# Patient Record
Sex: Female | Born: 1975 | Race: White | Hispanic: No | Marital: Married | Smoking: Former smoker
Health system: Southern US, Community
[De-identification: ages and names within clinical notes are randomized; demographics above are authoritative.]

## PROBLEM LIST (undated history)

## (undated) ENCOUNTER — Inpatient Hospital Stay (HOSPITAL_COMMUNITY): Payer: Self-pay

## (undated) DIAGNOSIS — A310 Pulmonary mycobacterial infection: Secondary | ICD-10-CM

## (undated) DIAGNOSIS — J45909 Unspecified asthma, uncomplicated: Secondary | ICD-10-CM

## (undated) HISTORY — PX: WISDOM TOOTH EXTRACTION: SHX21

## (undated) HISTORY — PX: BREAST BIOPSY: SHX20

---

## 2001-12-09 HISTORY — PX: SEPTOPLASTY: SUR1290

## 2016-12-09 DIAGNOSIS — A310 Pulmonary mycobacterial infection: Secondary | ICD-10-CM

## 2016-12-09 HISTORY — DX: Pulmonary mycobacterial infection: A31.0

## 2017-12-09 NOTE — L&D Delivery Note (Signed)
Delivery Note At 1:30 PM a viable female was delivered via Vaginal, Spontaneous (Presentation: OA;  ).  APGAR: 7, 8; weight pending.   Placenta status: 3VC, .  Cord: nuchal x1 with the following complications: none.  Cord pH: not sent  Anesthesia:  CLE Episiotomy: None Lacerations: None Suture Repair: N/A Est. Blood Loss (mL):  200cc   It's a girl - "Kelsey Griffith"!!   Mom to postpartum.  Baby to Couplet care / Skin to Skin.  Ranae Pilalise Jennifer Tyrees Chopin 12/08/2018, 1:53 PM

## 2018-05-08 LAB — OB RESULTS CONSOLE RUBELLA ANTIBODY, IGM: RUBELLA: IMMUNE

## 2018-05-08 LAB — OB RESULTS CONSOLE RPR: RPR: NONREACTIVE

## 2018-05-08 LAB — OB RESULTS CONSOLE HEPATITIS B SURFACE ANTIGEN: HEP B S AG: NEGATIVE

## 2018-05-08 LAB — OB RESULTS CONSOLE ANTIBODY SCREEN: Antibody Screen: NEGATIVE

## 2018-05-08 LAB — OB RESULTS CONSOLE ABO/RH: RH Type: POSITIVE

## 2018-05-08 LAB — OB RESULTS CONSOLE GC/CHLAMYDIA
Chlamydia: NEGATIVE
GC PROBE AMP, GENITAL: NEGATIVE

## 2018-05-08 LAB — OB RESULTS CONSOLE HIV ANTIBODY (ROUTINE TESTING): HIV: NONREACTIVE

## 2018-09-24 ENCOUNTER — Encounter (HOSPITAL_COMMUNITY): Payer: Self-pay | Admitting: *Deleted

## 2018-09-24 ENCOUNTER — Inpatient Hospital Stay (HOSPITAL_COMMUNITY)
Admission: AD | Admit: 2018-09-24 | Discharge: 2018-09-24 | Disposition: A | Discharge status: Home / Self Care | Payer: 59 | Source: Ambulatory Visit | Attending: Obstetrics and Gynecology | Admitting: Obstetrics and Gynecology

## 2018-09-24 DIAGNOSIS — Z3A29 29 weeks gestation of pregnancy: Secondary | ICD-10-CM | POA: Insufficient documentation

## 2018-09-24 DIAGNOSIS — R109 Unspecified abdominal pain: Secondary | ICD-10-CM | POA: Diagnosis present

## 2018-09-24 DIAGNOSIS — O4703 False labor before 37 completed weeks of gestation, third trimester: Secondary | ICD-10-CM

## 2018-09-24 HISTORY — DX: Pulmonary mycobacterial infection: A31.0

## 2018-09-24 HISTORY — DX: Unspecified asthma, uncomplicated: J45.909

## 2018-09-24 LAB — URINALYSIS, ROUTINE W REFLEX MICROSCOPIC
BILIRUBIN URINE: NEGATIVE
Glucose, UA: NEGATIVE mg/dL
Hgb urine dipstick: NEGATIVE
KETONES UR: 5 mg/dL — AB
Leukocytes, UA: NEGATIVE
NITRITE: NEGATIVE
Protein, ur: NEGATIVE mg/dL
Specific Gravity, Urine: 1.029 (ref 1.005–1.030)
pH: 5 (ref 5.0–8.0)

## 2018-09-24 NOTE — MAU Provider Note (Signed)
CC:  Chief Complaint  Patient presents with  . Abdominal Pain     First Provider Initiated Contact with Patient 09/24/18 1737      HPI: Kelsey Griffith is a 42 y.o. year old G32P2012 female at [redacted]w[redacted]d weeks gestation who presents to MAU reporting cramping  And occasional pelvic pressure x several days. States she called [facility] and was told to come to MAU for eval.   Associated Sx: Neg for urinary complaints, GI complaints, vaginal discharge Vaginal bleeding: denies Leaking of fluid: Denies Fetal movement: Very active  O:  Patient Vitals for the past 24 hrs:  BP Temp Temp src Pulse Resp SpO2 Height Weight  09/24/18 1651 108/67 98 F (36.7 C) Oral 68 16 99 % 5\' 5"  (1.651 m) 68.5 kg    General: NAD Heart: Regular rate Lungs: Normal rate and effort Abd: Soft, NT, Gravid, S=D Pelvic: NEFG, Neg pooling, neg blood.  Dilation: Closed Effacement (%): Thick Cervical Position: Posterior Station: Ballotable Presentation: Undeterminable Exam by:: Ivonne Andrew, CNM  EFM: 145, Moderate variability, 15 x 15 accelerations, no decelerations Toco: 1 Contraction  Orders Placed This Encounter  Procedures  . Urinalysis, Routine w reflex microscopic  . Encourage fluids  . Discharge patient   No orders of the defined types were placed in this encounter.   A: [redacted]w[redacted]d week IUP 1. Preterm uterine contractions, antepartum, third trimester   FHR reactive  P: Discharge home in stable condition. Preterm labor precautions and fetal kick counts. Push fluids Follow-up as scheduled for prenatal visit or sooner as needed if symptoms worsen. Return to maternity admissions as needed if symptoms worsen.  Katrinka Blazing, [place], CNM 09/24/2018 6:00 PM  3

## 2018-09-24 NOTE — Discharge Instructions (Signed)

## 2018-09-24 NOTE — MAU Note (Signed)
Pt reports she called the office because she was cramping, having some sharp pains off/on. Was told to come for monitoring.

## 2018-11-15 LAB — OB RESULTS CONSOLE GBS: GBS: POSITIVE

## 2018-11-17 ENCOUNTER — Encounter (HOSPITAL_COMMUNITY): Payer: Self-pay | Admitting: *Deleted

## 2018-11-17 ENCOUNTER — Inpatient Hospital Stay (HOSPITAL_COMMUNITY)
Admission: AD | Admit: 2018-11-17 | Discharge: 2018-11-17 | Disposition: A | Discharge status: Home / Self Care | Payer: 59 | Source: Ambulatory Visit | Attending: Obstetrics and Gynecology | Admitting: Obstetrics and Gynecology

## 2018-11-17 DIAGNOSIS — Z87891 Personal history of nicotine dependence: Secondary | ICD-10-CM | POA: Insufficient documentation

## 2018-11-17 DIAGNOSIS — O9989 Other specified diseases and conditions complicating pregnancy, childbirth and the puerperium: Secondary | ICD-10-CM | POA: Diagnosis not present

## 2018-11-17 DIAGNOSIS — O26893 Other specified pregnancy related conditions, third trimester: Secondary | ICD-10-CM

## 2018-11-17 DIAGNOSIS — R197 Diarrhea, unspecified: Secondary | ICD-10-CM | POA: Diagnosis not present

## 2018-11-17 DIAGNOSIS — Z880 Allergy status to penicillin: Secondary | ICD-10-CM | POA: Insufficient documentation

## 2018-11-17 DIAGNOSIS — Z3A37 37 weeks gestation of pregnancy: Secondary | ICD-10-CM | POA: Diagnosis not present

## 2018-11-17 LAB — URINALYSIS, ROUTINE W REFLEX MICROSCOPIC
Bilirubin Urine: NEGATIVE
GLUCOSE, UA: NEGATIVE mg/dL
Hgb urine dipstick: NEGATIVE
KETONES UR: NEGATIVE mg/dL
Leukocytes, UA: NEGATIVE
NITRITE: NEGATIVE
PH: 8 (ref 5.0–8.0)
PROTEIN: NEGATIVE mg/dL
Specific Gravity, Urine: 1.001 — ABNORMAL LOW (ref 1.005–1.030)

## 2018-11-17 MED ORDER — LACTATED RINGERS IV BOLUS
1000.0000 mL | Freq: Once | INTRAVENOUS | Status: AC
  Administered 2018-11-17: 1000 mL via INTRAVENOUS

## 2018-11-17 NOTE — MAU Note (Signed)
Pt C/O diarrhea since around Thanksgiving, feeling heart palpitations at night, unable to sleep.  No vomiting.  Feeling lightheaded & weak this a.m.  One episode of diarrhea this morning, four episodes in all.  "I have contractions all the time," denies bleeding or LOF.  Reports good fetal movement.

## 2018-11-17 NOTE — MAU Provider Note (Signed)
History     CSN: [id number]  Arrival date and time: 11/17/18 1025   First Provider Initiated Contact with Patient 11/17/18 1114      Chief Complaint  Patient presents with  . Diarrhea  . Irregular Heart Beat   HPI Trea Vedia CofferBlack is a 42 y.o. [id number] at 2140w3d who presents with 4 episodes of diarrhea since Thanksgiving Day. She states it comes irregularly and not related to food. She reports no nausea or vomiting. She also reports 2 times in the last 3 weeks where she woke up feeling like she was having palpitations. Denies any symptoms now. She reports difficulty sleeping. She denies any pain, vaginal bleeding or leaking of fluid. Reports normal fetal movement.   OB History    Gravida  4   Para  2   Term  2   Preterm      AB  1   Living  2     SAB      TAB  1   Ectopic      Multiple      Live Births  2           Past Medical History:  Diagnosis Date  . Asthma   . Pulmonary mycobacteria (HCC) 2018    Past Surgical History:  Procedure Laterality Date  . SEPTOPLASTY  2003    Family History  Problem Relation Age of Onset  . Rheum arthritis Mother   . Asthma Mother   . Heart disease Father     Social History   Tobacco Use  . Smoking status: Former Smoker    Types: Cigarettes  . Smokeless tobacco: Never Used  Substance Use Topics  . Alcohol use: Not Currently  . Drug use: Never    Allergies:  Allergies  Allergen Reactions  . Penicillins Anaphylaxis and Swelling    Has patient had a PCN reaction causing immediate rash, facial/tongue/throat swelling, SOB or lightheadedness with hypotension: Yes Has patient had a PCN reaction causing severe rash involving mucus membranes or skin necrosis: No Has patient had a PCN reaction that required hospitalization: No Has patient had a PCN reaction occurring within the last 10 years: No If all of the above answers are "NO", then may proceed with Cephalosporin use.   . Citalopram Other (See Comments) and  Rash    RASH and sedation sedation RASH and sedation     Medications Prior to Admission  Medication Sig Dispense Refill Last Dose  . albuterol (PROVENTIL) (2.5 MG/3ML) 0.083% nebulizer solution Inhale 2.5 mg into the lungs every 6 (six) hours as needed.    emergency    Review of Systems  Constitutional: Negative.  Negative for fatigue and fever.  HENT: Negative.   Respiratory: Negative.  Negative for shortness of breath.   Cardiovascular: Positive for palpitations. Negative for chest pain.  Gastrointestinal: Positive for diarrhea. Negative for abdominal pain, constipation, nausea and vomiting.  Genitourinary: Negative.  Negative for dysuria, vaginal bleeding and vaginal discharge.  Neurological: Negative.  Negative for dizziness and headaches.   Physical Exam   Blood pressure 119/69, pulse 80, temperature 97.9 F (36.6 C), resp. rate 16, weight 72.1 kg, SpO2 100 %.  Physical Exam  Nursing note and vitals reviewed. Constitutional: She is oriented to person, place, and time. She appears well-developed and well-nourished. No distress.  HENT:  Head: Normocephalic.  Eyes: Pupils are equal, round, and reactive to light.  Cardiovascular: Normal rate, regular rhythm and normal heart sounds.  Respiratory: Effort normal and  breath sounds normal. No respiratory distress.  GI: Soft. Bowel sounds are normal. She exhibits no distension. There is no tenderness.  Neurological: She is alert and oriented to person, place, and time.  Skin: Skin is warm and dry.  Psychiatric: She has a normal mood and affect. Her behavior is normal. Judgment and thought content normal.     Fetal Tracing:  Baseline: 135 Variability: moderate Accels: 15x15 Decels: none  Toco: occasional uc's   MAU Course  Procedures Results for orders placed or performed during the hospital encounter of 11/17/18 (from the past 24 hour(s))  Urinalysis, Routine w reflex microscopic     Status: Abnormal   Collection  Time: 11/17/18 10:55 AM  Result Value Ref Range   Color, Urine STRAW (A) YELLOW   APPearance CLEAR CLEAR   Specific Gravity, Urine 1.001 (L) 1.005 - 1.030   pH 8.0 5.0 - 8.0   Glucose, UA NEGATIVE NEGATIVE mg/dL   Hgb urine dipstick NEGATIVE NEGATIVE   Bilirubin Urine NEGATIVE NEGATIVE   Ketones, ur NEGATIVE NEGATIVE mg/dL   Protein, ur NEGATIVE NEGATIVE mg/dL   Nitrite NEGATIVE NEGATIVE   Leukocytes, UA NEGATIVE NEGATIVE   MDM UA NST LR bolus  Assessment and Plan   1. Diarrhea, unspecified type   2. [redacted] weeks gestation of pregnancy    -Discharge home in stable condition -OTC options for sleep reviewed -Labor precautions discussed -Patient advised to follow-up with [facility] as scheduled for prenatal care -Patient may return to MAU as needed or if her condition were to change or worsen   Rolm Bookbinder CNM 11/17/2018, 11:14 AM

## 2018-11-17 NOTE — Discharge Instructions (Signed)

## 2018-12-03 ENCOUNTER — Telehealth (HOSPITAL_COMMUNITY): Payer: Self-pay | Admitting: *Deleted

## 2018-12-03 ENCOUNTER — Encounter (HOSPITAL_COMMUNITY): Payer: Self-pay | Admitting: *Deleted

## 2018-12-04 NOTE — H&P (Signed)
Kelsey Griffith is a 42 y.o. female presenting for IOL with unfavorable cervix. She is AMA, has a h/o asthma, and anxiety. OB History    Gravida  4   Para  2   Term  2   Preterm      AB  1   Living  2     SAB      TAB  1   Ectopic      Multiple      Live Births  2          Past Medical History:  Diagnosis Date  . Asthma   . Pulmonary mycobacteria (HCC) 2018   Past Surgical History:  Procedure Laterality Date  . BREAST BIOPSY    . SEPTOPLASTY  2003   Family History: family history includes Arthritis/Rheumatoid in her mother; Asthma in her mother; Bipolar disorder in her brother; Breast cancer in her maternal aunt; Diabetes in her brother and paternal grandfather; Heart disease in her father; Hypertension in her father and paternal grandfather; Lung cancer in her maternal grandmother; Rheum arthritis in her mother; Schizophrenia in her brother. Social History:  reports that she has quit smoking. Her smoking use included cigarettes. She has never used smokeless tobacco. She reports previous alcohol use. She reports that she does not use drugs.     Maternal Diabetes: No Genetic Screening: Normal Maternal Ultrasounds/Referrals: Normal Fetal Ultrasounds or other Referrals:  None Maternal Substance Abuse:  No Significant Maternal Medications:  None Significant Maternal Lab Results:  None Other Comments:  None  ROS History   There were no vitals taken for this visit. Exam Physical Exam  (from office) NAD, A&O NWOB Abd soft, nondistended, gravid  Prenatal labs: ABO, Rh: O/Positive/-- (05/31 0000) Antibody: Negative (05/31 0000) Rubella: Immune (05/31 0000) RPR: Nonreactive (05/31 0000)  HBsAg: Negative (05/31 0000)  HIV: Non-reactive (05/31 0000)  GBS: Positive (12/08 0000)   Assessment/Plan: 42yo [id number] @ 40.2 wga presenting for IOL. She is AMA, has a h/o asthma, and anxiety. Her cervix is unfavorable.  Plan for cytotec followed by pitocin/AROM when  more favorable.  Expecting female infant. GSB pos: Patient prefers cefazolin however she has a h/o anaphylaxis with PCN and I recommend vanc unless she has safely had ancef previously. Her GBS in clinda RESISTANT.     Kelsey Griffith 12/04/2018, 2:28 PM

## 2018-12-06 ENCOUNTER — Inpatient Hospital Stay (HOSPITAL_COMMUNITY): Admit: 2018-12-06 | Payer: Self-pay

## 2018-12-08 ENCOUNTER — Inpatient Hospital Stay (HOSPITAL_COMMUNITY)
Admission: RE | Admit: 2018-12-08 | Discharge: 2018-12-10 | DRG: 807 | Disposition: A | Discharge status: Home / Self Care | Payer: 59 | Attending: Obstetrics and Gynecology | Admitting: Obstetrics and Gynecology

## 2018-12-08 ENCOUNTER — Inpatient Hospital Stay (HOSPITAL_COMMUNITY): Payer: 59 | Admitting: Anesthesiology

## 2018-12-08 ENCOUNTER — Encounter (HOSPITAL_COMMUNITY): Payer: Self-pay

## 2018-12-08 ENCOUNTER — Other Ambulatory Visit: Payer: Self-pay

## 2018-12-08 DIAGNOSIS — Z3490 Encounter for supervision of normal pregnancy, unspecified, unspecified trimester: Secondary | ICD-10-CM

## 2018-12-08 DIAGNOSIS — Z23 Encounter for immunization: Secondary | ICD-10-CM

## 2018-12-08 DIAGNOSIS — O99824 Streptococcus B carrier state complicating childbirth: Secondary | ICD-10-CM | POA: Diagnosis present

## 2018-12-08 DIAGNOSIS — Z880 Allergy status to penicillin: Secondary | ICD-10-CM

## 2018-12-08 DIAGNOSIS — Z3483 Encounter for supervision of other normal pregnancy, third trimester: Secondary | ICD-10-CM | POA: Diagnosis present

## 2018-12-08 DIAGNOSIS — Z87891 Personal history of nicotine dependence: Secondary | ICD-10-CM | POA: Diagnosis not present

## 2018-12-08 DIAGNOSIS — Z3A40 40 weeks gestation of pregnancy: Secondary | ICD-10-CM

## 2018-12-08 LAB — TYPE AND SCREEN
ABO/RH(D): O POS
Antibody Screen: NEGATIVE

## 2018-12-08 LAB — CBC
HEMATOCRIT: 34.2 % — AB (ref 36.0–46.0)
Hemoglobin: 11.4 g/dL — ABNORMAL LOW (ref 12.0–15.0)
MCH: 31.5 pg (ref 26.0–34.0)
MCHC: 33.3 g/dL (ref 30.0–36.0)
MCV: 94.5 fL (ref 80.0–100.0)
Platelets: 248 10*3/uL (ref 150–400)
RBC: 3.62 MIL/uL — ABNORMAL LOW (ref 3.87–5.11)
RDW: 13.3 % (ref 11.5–15.5)
WBC: 8.5 10*3/uL (ref 4.0–10.5)

## 2018-12-08 LAB — ABO/RH: ABO/RH(D): O POS

## 2018-12-08 LAB — RPR: RPR Ser Ql: NONREACTIVE

## 2018-12-08 MED ORDER — PHENYLEPHRINE 40 MCG/ML (10ML) SYRINGE FOR IV PUSH (FOR BLOOD PRESSURE SUPPORT)
80.0000 ug | PREFILLED_SYRINGE | INTRAVENOUS | Status: DC | PRN
  Filled 2018-12-08: qty 10

## 2018-12-08 MED ORDER — SENNOSIDES-DOCUSATE SODIUM 8.6-50 MG PO TABS
2.0000 | ORAL_TABLET | ORAL | Status: DC
  Administered 2018-12-09 – 2018-12-10 (×2): 2 via ORAL
  Filled 2018-12-08 (×2): qty 2

## 2018-12-08 MED ORDER — BUTORPHANOL TARTRATE 1 MG/ML IJ SOLN: 1.0000 mg | INTRAMUSCULAR | Status: DC | PRN

## 2018-12-08 MED ORDER — ONDANSETRON HCL 4 MG/2ML IJ SOLN: 4.0000 mg | INTRAMUSCULAR | Status: DC | PRN

## 2018-12-08 MED ORDER — TERBUTALINE SULFATE 1 MG/ML IJ SOLN: 0.2500 mg | Freq: Once | INTRAMUSCULAR | Status: DC | PRN

## 2018-12-08 MED ORDER — DIPHENHYDRAMINE HCL 50 MG/ML IJ SOLN: 12.5000 mg | INTRAMUSCULAR | Status: DC | PRN

## 2018-12-08 MED ORDER — ONDANSETRON HCL 4 MG/2ML IJ SOLN: 4.0000 mg | Freq: Four times a day (QID) | INTRAMUSCULAR | Status: DC | PRN

## 2018-12-08 MED ORDER — DIPHENHYDRAMINE HCL 25 MG PO CAPS: 25.0000 mg | ORAL_CAPSULE | Freq: Four times a day (QID) | ORAL | Status: DC | PRN

## 2018-12-08 MED ORDER — IBUPROFEN 600 MG PO TABS
600.0000 mg | ORAL_TABLET | Freq: Four times a day (QID) | ORAL | Status: DC
  Administered 2018-12-09 – 2018-12-10 (×7): 600 mg via ORAL
  Filled 2018-12-08 (×8): qty 1

## 2018-12-08 MED ORDER — LIDOCAINE HCL (PF) 1 % IJ SOLN: 30.0000 mL | INTRAMUSCULAR | Status: DC | PRN

## 2018-12-08 MED ORDER — FLUTICASONE FUROATE-VILANTEROL 200-25 MCG/INH IN AEPB: 1.0000 | INHALATION_SPRAY | Freq: Every day | RESPIRATORY_TRACT | Status: DC

## 2018-12-08 MED ORDER — LACTATED RINGERS IV SOLN: 500.0000 mL | Freq: Once | INTRAVENOUS | Status: DC

## 2018-12-08 MED ORDER — SIMETHICONE 80 MG PO CHEW: 80.0000 mg | CHEWABLE_TABLET | ORAL | Status: DC | PRN

## 2018-12-08 MED ORDER — LACTATED RINGERS IV SOLN
INTRAVENOUS | Status: DC
  Administered 2018-12-08 (×3): via INTRAVENOUS

## 2018-12-08 MED ORDER — DIBUCAINE 1 % RE OINT: 1.0000 "application " | TOPICAL_OINTMENT | RECTAL | Status: DC | PRN

## 2018-12-08 MED ORDER — ACETAMINOPHEN 325 MG PO TABS: 650.0000 mg | ORAL_TABLET | ORAL | Status: DC | PRN

## 2018-12-08 MED ORDER — OXYCODONE-ACETAMINOPHEN 5-325 MG PO TABS: 2.0000 | ORAL_TABLET | ORAL | Status: DC | PRN

## 2018-12-08 MED ORDER — OXYCODONE HCL 5 MG PO TABS: 5.0000 mg | ORAL_TABLET | ORAL | Status: DC | PRN

## 2018-12-08 MED ORDER — BENZOCAINE-MENTHOL 20-0.5 % EX AERO
1.0000 "application " | INHALATION_SPRAY | CUTANEOUS | Status: DC | PRN
  Administered 2018-12-08: 1 via TOPICAL
  Filled 2018-12-08: qty 56

## 2018-12-08 MED ORDER — EPHEDRINE 5 MG/ML INJ: 10.0000 mg | INTRAVENOUS | Status: DC | PRN

## 2018-12-08 MED ORDER — LIDOCAINE HCL (PF) 1 % IJ SOLN
INTRAMUSCULAR | Status: DC | PRN
  Administered 2018-12-08 (×2): 5 mL via EPIDURAL

## 2018-12-08 MED ORDER — SOD CITRATE-CITRIC ACID 500-334 MG/5ML PO SOLN: 30.0000 mL | ORAL | Status: DC | PRN

## 2018-12-08 MED ORDER — TETANUS-DIPHTH-ACELL PERTUSSIS 5-2.5-18.5 LF-MCG/0.5 IM SUSP: 0.5000 mL | Freq: Once | INTRAMUSCULAR | Status: DC

## 2018-12-08 MED ORDER — FENTANYL 2.5 MCG/ML BUPIVACAINE 1/10 % EPIDURAL INFUSION (WH - ANES)
14.0000 mL/h | INTRAMUSCULAR | Status: DC | PRN
  Administered 2018-12-08: 14 mL/h via EPIDURAL
  Filled 2018-12-08: qty 100

## 2018-12-08 MED ORDER — FLUTICASONE PROPIONATE 50 MCG/ACT NA SUSP
2.0000 | Freq: Every day | NASAL | Status: DC
  Administered 2018-12-08: 2 via NASAL
  Filled 2018-12-08: qty 16

## 2018-12-08 MED ORDER — OXYCODONE-ACETAMINOPHEN 5-325 MG PO TABS: 1.0000 | ORAL_TABLET | ORAL | Status: DC | PRN

## 2018-12-08 MED ORDER — PRENATAL MULTIVITAMIN CH
1.0000 | ORAL_TABLET | Freq: Every day | ORAL | Status: DC
  Administered 2018-12-09 – 2018-12-10 (×2): 1 via ORAL
  Filled 2018-12-08 (×2): qty 1

## 2018-12-08 MED ORDER — VANCOMYCIN HCL IN DEXTROSE 1-5 GM/200ML-% IV SOLN
1000.0000 mg | Freq: Two times a day (BID) | INTRAVENOUS | Status: DC
  Administered 2018-12-08: 1000 mg via INTRAVENOUS
  Filled 2018-12-08 (×3): qty 200

## 2018-12-08 MED ORDER — COCONUT OIL OIL: 1.0000 "application " | TOPICAL_OIL | Status: DC | PRN

## 2018-12-08 MED ORDER — WITCH HAZEL-GLYCERIN EX PADS: 1.0000 "application " | MEDICATED_PAD | CUTANEOUS | Status: DC | PRN

## 2018-12-08 MED ORDER — FLUTICASONE-SALMETEROL 250-50 MCG/DOSE IN AEPB
1.0000 | INHALATION_SPRAY | Freq: Two times a day (BID) | RESPIRATORY_TRACT | Status: DC
  Administered 2018-12-08 – 2018-12-09 (×2): 1 via RESPIRATORY_TRACT

## 2018-12-08 MED ORDER — ZOLPIDEM TARTRATE 5 MG PO TABS: 5.0000 mg | ORAL_TABLET | Freq: Every evening | ORAL | Status: DC | PRN

## 2018-12-08 MED ORDER — LACTATED RINGERS IV SOLN: 500.0000 mL | INTRAVENOUS | Status: DC | PRN

## 2018-12-08 MED ORDER — OXYTOCIN BOLUS FROM INFUSION
500.0000 mL | Freq: Once | INTRAVENOUS | Status: AC
  Administered 2018-12-08: 500 mL via INTRAVENOUS

## 2018-12-08 MED ORDER — ONDANSETRON HCL 4 MG PO TABS
4.0000 mg | ORAL_TABLET | ORAL | Status: DC | PRN
  Administered 2018-12-08: 4 mg via ORAL
  Filled 2018-12-08: qty 1

## 2018-12-08 MED ORDER — ALBUTEROL SULFATE (2.5 MG/3ML) 0.083% IN NEBU: 2.5000 mg | INHALATION_SOLUTION | Freq: Four times a day (QID) | RESPIRATORY_TRACT | Status: DC | PRN

## 2018-12-08 MED ORDER — OXYTOCIN 40 UNITS IN LACTATED RINGERS INFUSION - SIMPLE MED
2.5000 [IU]/h | INTRAVENOUS | Status: DC
  Administered 2018-12-08: 2.5 [IU]/h via INTRAVENOUS

## 2018-12-08 MED ORDER — PNEUMOCOCCAL VAC POLYVALENT 25 MCG/0.5ML IJ INJ
0.5000 mL | INJECTION | INTRAMUSCULAR | Status: AC
  Administered 2018-12-09: 0.5 mL via INTRAMUSCULAR
  Filled 2018-12-08 (×2): qty 0.5

## 2018-12-08 MED ORDER — MISOPROSTOL 25 MCG QUARTER TABLET
25.0000 ug | ORAL_TABLET | ORAL | Status: DC | PRN
  Administered 2018-12-08 (×2): 25 ug via VAGINAL
  Filled 2018-12-08 (×2): qty 1

## 2018-12-08 MED ORDER — OXYTOCIN 40 UNITS IN LACTATED RINGERS INFUSION - SIMPLE MED
1.0000 m[IU]/min | INTRAVENOUS | Status: DC
  Administered 2018-12-08: 2 m[IU]/min via INTRAVENOUS
  Filled 2018-12-08: qty 1000

## 2018-12-08 MED ORDER — OXYCODONE HCL 5 MG PO TABS: 10.0000 mg | ORAL_TABLET | ORAL | Status: DC | PRN

## 2018-12-08 NOTE — Anesthesia Preprocedure Evaluation (Signed)
Anesthesia Evaluation  Patient identified by MRN, date of birth, ID band Patient awake    Reviewed: Allergy & Precautions, H&P , NPO status , Patient's Chart, lab work & pertinent test results  History of Anesthesia Complications Negative for: history of anesthetic complications  Airway Mallampati: II  TM Distance: >3 FB Neck ROM: full    Dental no notable dental hx. (+) Teeth Intact   Pulmonary asthma , former smoker,    Pulmonary exam normal breath sounds clear to auscultation       Cardiovascular negative cardio ROS Normal cardiovascular exam Rhythm:regular Rate:Normal     Neuro/Psych negative neurological ROS  negative psych ROS   GI/Hepatic negative GI ROS, Neg liver ROS,   Endo/Other  negative endocrine ROS  Renal/GU negative Renal ROS  negative genitourinary   Musculoskeletal   Abdominal   Peds  Hematology negative hematology ROS (+)   Anesthesia Other Findings   Reproductive/Obstetrics (+) Pregnancy                             Anesthesia Physical Anesthesia Plan  ASA: II  Anesthesia Plan: Epidural   Post-op Pain Management:    Induction:   PONV Risk Score and Plan:   Airway Management Planned:   Additional Equipment:   Intra-op Plan:   Post-operative Plan:   Informed Consent: I have reviewed the patients History and Physical, chart, labs and discussed the procedure including the risks, benefits and alternatives for the proposed anesthesia with the patient or authorized representative who has indicated his/her understanding and acceptance.     Plan Discussed with:   Anesthesia Plan Comments:         Anesthesia Quick Evaluation  

## 2018-12-08 NOTE — Anesthesia Postprocedure Evaluation (Signed)
Anesthesia Post Note  Patient: Kelsey Griffith  Procedure(s) Performed: AN AD HOC LABOR EPIDURAL     Patient location during evaluation: Mother Baby Anesthesia Type: Epidural Level of consciousness: awake Pain management: pain level controlled Vital Signs Assessment: post-procedure vital signs reviewed and stable Respiratory status: spontaneous breathing Cardiovascular status: stable Postop Assessment: patient able to bend at knees and epidural receding Anesthetic complications: no    Last Vitals:  Vitals:   12/08/18 1547 12/08/18 1635  BP: 130/82 127/80  Pulse: 82 85  Resp: 17 18  Temp: 37.1 C 37 C  SpO2: 97% 97%    Last Pain:  Vitals:   12/08/18 1548  TempSrc:   PainSc: 0-No pain   Pain Goal:                 Edison PaceWILKERSON,Allin Frix

## 2018-12-08 NOTE — Progress Notes (Signed)
Comf.  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

## 2018-12-08 NOTE — Progress Notes (Signed)
SVE 6/80/-2 per RN Cont pitocin

## 2018-12-08 NOTE — Anesthesia Pain Management Evaluation Note (Signed)
  CRNA Pain Management Visit Note  Patient: Kelsey Griffith, 42 y.o., female  "Hello I am a member of the anesthesia team at [facility]. We have an anesthesia team available at all times to provide care throughout the hospital, including epidural management and anesthesia for C-section. I don't know your plan for the delivery whether it a natural birth, water birth, IV sedation, nitrous supplementation, doula or epidural, but we want to meet your pain goals."   1.Was your pain managed to your expectations on prior hospitalizations?   Yes   2.What is your expectation for pain management during this hospitalization?     Epidural and IV pain meds  3.How can we help you reach that goal? Planning epidural  Record the patient's initial score and the patient's pain goal.   Pain: 4  Pain Goal: 6 [facility] wants you to be able to say your pain was always managed very well.  [facility] Behavior[facility] HealthMERRITT,Ireoluwa Gorsline 12/08/2018

## 2018-12-08 NOTE — Anesthesia Procedure Notes (Signed)
Epidural Patient location during procedure: OB  Staffing Anesthesiologist: Sharri Loya, MD Performed: anesthesiologist   Preanesthetic Checklist Completed: patient identified, site marked, surgical consent, pre-op evaluation, timeout performed, IV checked, risks and benefits discussed and monitors and equipment checked  Epidural Patient position: sitting Prep: DuraPrep Patient monitoring: heart rate, continuous pulse ox and blood pressure Approach: right paramedian Location: L3-L4 Injection technique: LOR saline  Needle:  Needle type: Tuohy  Needle gauge: 17 G Needle length: 9 cm and 9 Needle insertion depth: 5 cm Catheter type: closed end flexible Catheter size: 20 Guage Catheter at skin depth: 9 cm Test dose: negative  Assessment Events: blood not aspirated, injection not painful, no injection resistance, negative IV test and no paresthesia  Additional Notes Patient identified. Risks/Benefits/Options discussed with patient including but not limited to bleeding, infection, nerve damage, paralysis, failed block, incomplete pain control, headache, blood pressure changes, nausea, vomiting, reactions to medication both or allergic, itching and postpartum back pain. Confirmed with bedside nurse the patient's most recent platelet count. Confirmed with patient that they are not currently taking any anticoagulation, have any bleeding history or any family history of bleeding disorders. Patient expressed understanding and wished to proceed. All questions were answered. Sterile technique was used throughout the entire procedure. Please see nursing notes for vital signs. Test dose was given through epidural needle and negative prior to continuing to dose epidural or start infusion. Warning signs of high block given to the patient including shortness of breath, tingling/numbness in hands, complete motor block, or any concerning symptoms with instructions to call for help. Patient was given  instructions on fall risk and not to get out of bed. All questions and concerns addressed with instructions to call with any issues.     

## 2018-12-08 NOTE — Progress Notes (Signed)
[name] was referred for history of depression/anxiety. * Referral screened out by Clinical Social Worker because none of the following criteria appear to apply: ~ History of anxiety/depression during this pregnancy, or of post-partum depression following prior delivery. ~ Diagnosis of anxiety and/or depression within last 3 years OR * [name]'s symptoms currently being treated with medication and/or therapy. [name] has an active Rx for Wellbutrin.  Please contact the Clinical Social Worker if needs arise, by [name] request, or if [name] scores greater than 9/yes to question 10 on Edinburgh Postpartum Depression Screen.  Kelsey Griffith, MSW, LCSW Clinical Social Work [phone]  

## 2018-12-09 LAB — CBC
HCT: 31.4 % — ABNORMAL LOW (ref 36.0–46.0)
Hemoglobin: 10.3 g/dL — ABNORMAL LOW (ref 12.0–15.0)
MCH: 31 pg (ref 26.0–34.0)
MCHC: 32.8 g/dL (ref 30.0–36.0)
MCV: 94.6 fL (ref 80.0–100.0)
Platelets: 196 10*3/uL (ref 150–400)
RBC: 3.32 MIL/uL — ABNORMAL LOW (ref 3.87–5.11)
RDW: 13.4 % (ref 11.5–15.5)
WBC: 11.8 10*3/uL — ABNORMAL HIGH (ref 4.0–10.5)
nRBC: 0 % (ref 0.0–0.2)

## 2018-12-09 NOTE — Lactation Note (Signed)
This note was copied from a baby's chart. Lactation Consultation Note  Patient Name: Kelsey Griffith Today's Date: 12/09/2018 Reason for consult: Initial assessment;Term P3, 17 hour female infant. LC entered room infant asleep in basinet.  Per mom, she breastfeed other 2 children for 12 and 13 months. Mom doesn't have breast pump at home but plans to rent one from downstairs gift shop. Per mom, infant had 3 voids and 3 stools since delivery. Per mom, infant last breastfeed for 20 minutes at 5 am LC did not observe latch at this time. Mom is confident with breastfeeding abilities and feels breastfeeding is going well. Mom will continue to do STS. LC discussed I & O. Mom will breastfeed according hunger cues, 8 to 12 times within 24 hours including nights and not exceed 3 hours without breastfeeding infant. Mom will call Nurse or LC if she has any breastfeeding questions, concerns or needs assistance with latching infant to breast. Reviewed Baby & Me book's Breastfeeding Basics.  Mom made aware of O/P services, breastfeeding support groups, community resources, and our phone # for post-discharge questions.    Maternal Data Formula Feeding for Exclusion: No Has patient been taught Hand Expression?: Yes Does the patient have breastfeeding experience prior to this delivery?: Yes  Feeding    LATCH Score                   Interventions Interventions: Breast feeding basics reviewed;Hand express  Lactation Tools Discussed/Used WIC Program: No   Consult Status Consult Status: Follow-up Date: 12/09/18 Follow-up type: In-patient    Danelle Earthly 12/09/2018, 7:11 AM

## 2018-12-09 NOTE — Progress Notes (Signed)
Pt request to stay additional day. DC cancelled.

## 2018-12-09 NOTE — Discharge Summary (Signed)
Obstetric Discharge Summary Reason for Admission: induction of labor Prenatal Procedures: none Intrapartum Procedures: spontaneous vaginal delivery Postpartum Procedures: none Complications-Operative and Postpartum: none Hemoglobin  Date Value Ref Range Status  12/09/2018 10.3 (L) 12.0 - 15.0 g/dL Final   HCT  Date Value Ref Range Status  12/09/2018 31.4 (L) 36.0 - 46.0 % Final    Physical Exam:  General: alert, cooperative and appears stated age 43: appropriate Uterine Fundus: firm Incision: N/A DVT Evaluation: No evidence of DVT seen on physical exam. Negative Homan's sign. No cords or calf tenderness. No significant calf/ankle edema.  Discharge Diagnoses: Term Pregnancy-delivered  Discharge Information: Date: 12/09/2018 Activity: pelvic rest Diet: routine Medications: None Condition: stable Instructions: refer to practice specific booklet Discharge to: home   Newborn Data: Live born female  Birth Weight: 7 lb 8.5 oz (3416 g) APGAR: 7, 8  Newborn Delivery   Birth date/time:  12/08/2018 13:30:00 Delivery type:  Vaginal, Spontaneous     Home with mother.  Kelsey Griffith 12/09/2018, 8:14 AM

## 2018-12-10 NOTE — Progress Notes (Signed)
PPD #2 Ready to go home VSS Afeb D/C home Instructions reveiwed

## 2018-12-10 NOTE — Lactation Note (Signed)
This note was copied from a baby's chart. Lactation Consultation Note  Patient Name: Kelsey Griffith Today's Date: 12/10/2018 Reason for consult: Follow-up assessment  Visited with P3 Mom of term baby at 107 hrs old.  Baby at 7% weight loss.  Mom denies any difficulty with latching to the breast.   Encouraged STS and cue based feedings.  Engorgement prevention and treatment reviewed  Mom aware of OP lactation support available and encouraged to call prn.   Consult Status Consult Status: Complete Date: 12/10/18 Follow-up type: Call as needed    Kelsey Griffith 12/10/2018, 11:56 AM

## 2018-12-12 ENCOUNTER — Inpatient Hospital Stay (HOSPITAL_COMMUNITY)
Admission: AD | Admit: 2018-12-12 | Discharge: 2018-12-13 | Disposition: A | Discharge status: Home / Self Care | Payer: Managed Care, Other (non HMO) | Source: Ambulatory Visit | Attending: Emergency Medicine | Admitting: Emergency Medicine

## 2018-12-12 ENCOUNTER — Encounter (HOSPITAL_COMMUNITY): Payer: Self-pay | Admitting: *Deleted

## 2018-12-12 ENCOUNTER — Inpatient Hospital Stay (HOSPITAL_COMMUNITY): Payer: Managed Care, Other (non HMO)

## 2018-12-12 DIAGNOSIS — Z87891 Personal history of nicotine dependence: Secondary | ICD-10-CM | POA: Insufficient documentation

## 2018-12-12 DIAGNOSIS — R531 Weakness: Secondary | ICD-10-CM | POA: Diagnosis not present

## 2018-12-12 DIAGNOSIS — R001 Bradycardia, unspecified: Secondary | ICD-10-CM | POA: Diagnosis not present

## 2018-12-12 DIAGNOSIS — I10 Essential (primary) hypertension: Secondary | ICD-10-CM

## 2018-12-12 DIAGNOSIS — O165 Unspecified maternal hypertension, complicating the puerperium: Secondary | ICD-10-CM | POA: Diagnosis not present

## 2018-12-12 DIAGNOSIS — R6883 Chills (without fever): Secondary | ICD-10-CM | POA: Insufficient documentation

## 2018-12-12 DIAGNOSIS — R1013 Epigastric pain: Secondary | ICD-10-CM | POA: Diagnosis not present

## 2018-12-12 DIAGNOSIS — Z8249 Family history of ischemic heart disease and other diseases of the circulatory system: Secondary | ICD-10-CM | POA: Diagnosis not present

## 2018-12-12 DIAGNOSIS — Z79899 Other long term (current) drug therapy: Secondary | ICD-10-CM | POA: Diagnosis not present

## 2018-12-12 DIAGNOSIS — Z880 Allergy status to penicillin: Secondary | ICD-10-CM | POA: Insufficient documentation

## 2018-12-12 DIAGNOSIS — Z888 Allergy status to other drugs, medicaments and biological substances status: Secondary | ICD-10-CM | POA: Insufficient documentation

## 2018-12-12 DIAGNOSIS — R600 Localized edema: Secondary | ICD-10-CM | POA: Diagnosis not present

## 2018-12-12 DIAGNOSIS — R079 Chest pain, unspecified: Secondary | ICD-10-CM | POA: Diagnosis not present

## 2018-12-12 DIAGNOSIS — J45909 Unspecified asthma, uncomplicated: Secondary | ICD-10-CM | POA: Insufficient documentation

## 2018-12-12 LAB — COMPREHENSIVE METABOLIC PANEL
ALT: 33 U/L (ref 0–44)
AST: 41 U/L (ref 15–41)
Albumin: 2.8 g/dL — ABNORMAL LOW (ref 3.5–5.0)
Alkaline Phosphatase: 101 U/L (ref 38–126)
Anion gap: 5 (ref 5–15)
BUN: 7 mg/dL (ref 6–20)
CALCIUM: 8.3 mg/dL — AB (ref 8.9–10.3)
CO2: 20 mmol/L — ABNORMAL LOW (ref 22–32)
Chloride: 109 mmol/L (ref 98–111)
Creatinine, Ser: 0.44 mg/dL (ref 0.44–1.00)
GFR calc Af Amer: >60 mL/min (ref >60)
GFR calc non Af Amer: >60 mL/min (ref >60)
Glucose, Bld: 83 mg/dL (ref 70–99)
Potassium: 3.8 mmol/L (ref 3.5–5.1)
Sodium: 134 mmol/L — ABNORMAL LOW (ref 135–145)
Total Bilirubin: 0.3 mg/dL (ref 0.3–1.2)
Total Protein: 5.3 g/dL — ABNORMAL LOW (ref 6.5–8.1)

## 2018-12-12 LAB — URINALYSIS, ROUTINE W REFLEX MICROSCOPIC
BACTERIA UA: NONE SEEN
BILIRUBIN URINE: NEGATIVE
Glucose, UA: NEGATIVE mg/dL
Ketones, ur: NEGATIVE mg/dL
Leukocytes, UA: NEGATIVE
Nitrite: NEGATIVE
Protein, ur: NEGATIVE mg/dL
Specific Gravity, Urine: 1.002 — ABNORMAL LOW (ref 1.005–1.030)
pH: 8 (ref 5.0–8.0)

## 2018-12-12 LAB — PROTEIN / CREATININE RATIO, URINE
CREATININE, URINE: 19 mg/dL
Total Protein, Urine: <6 mg/dL

## 2018-12-12 LAB — RAPID URINE DRUG SCREEN, HOSP PERFORMED
Amphetamines: NOT DETECTED
Barbiturates: NOT DETECTED
Benzodiazepines: NOT DETECTED
Cocaine: NOT DETECTED
Opiates: NOT DETECTED
Tetrahydrocannabinol: NOT DETECTED

## 2018-12-12 LAB — CBC
HCT: 32.8 % — ABNORMAL LOW (ref 36.0–46.0)
Hemoglobin: 11 g/dL — ABNORMAL LOW (ref 12.0–15.0)
MCH: 31.5 pg (ref 26.0–34.0)
MCHC: 33.5 g/dL (ref 30.0–36.0)
MCV: 94 fL (ref 80.0–100.0)
Platelets: 241 10*3/uL (ref 150–400)
RBC: 3.49 MIL/uL — ABNORMAL LOW (ref 3.87–5.11)
RDW: 13.4 % (ref 11.5–15.5)
WBC: 6.9 10*3/uL (ref 4.0–10.5)
nRBC: 0 % (ref 0.0–0.2)

## 2018-12-12 LAB — BRAIN NATRIURETIC PEPTIDE: B Natriuretic Peptide: 206.2 pg/mL — ABNORMAL HIGH (ref 0.0–100.0)

## 2018-12-12 LAB — TROPONIN I: Troponin I: <0.03 ng/mL (ref <0.03)

## 2018-12-12 LAB — LIPASE, BLOOD: Lipase: 27 U/L (ref 11–51)

## 2018-12-12 NOTE — MAU Note (Signed)
Urine in lab 

## 2018-12-12 NOTE — Progress Notes (Addendum)
Started pt IV in right forearm, it was running well but the patient said it was uncomfortable and asked to have it taken out.  The next IV start blew in her left hand and a second nurse attempted a line and the vein blew upon starting the fluid.  Provider states we can wait until after radiology report comes back from chest x-ray and discuss attempting to get an IV.   Provider aware the patient does not have an IV ok with transferring without access.

## 2018-12-12 NOTE — ED Notes (Signed)
Pt arrives via [facility] from [employer] [facility]. Pt is 5 days postpartum with no problems during this pregnancy. Per [facility], pt has been sinus brady. Last BP 147/82. Pt endorses 4/10 epigastric pain.

## 2018-12-12 NOTE — Progress Notes (Signed)
Report called to [facility] ED and [facility], ETA about 2230.

## 2018-12-12 NOTE — MAU Note (Addendum)
Kelsey Griffith is a 43 y.o.  here in MAU reporting: +upper abdomen tenderness +chills for 2-3 hours +cough Onset of complaint: 4am BP at home 142/94 Denies headache, fever, visual changes States something just doesn't feel right Pain score: 6-7/10 Vaginal delivery on 12/31 Lab orders placed from triage: ua

## 2018-12-12 NOTE — ED Provider Notes (Signed)
[facility] EMERGENCY DEPARTMENT Provider Note  CSN: [id number] Arrival date & time: 12/12/18 1746  Chief Complaint(s) Hypertension  HPI Kelsey Griffith is a 43 y.o. female G4 P3-0-1-3 who was sent from [facility]U for evaluation of epigastric pain, bilateral upper and lower extremity edema and hypertension at 5 days postpartum.  Patient describes the epigastric pain as a constant pressure/sharp pain with some burning aspects.  Exacerbated with ambulation (stating that the steps make it feel like things are shifting inside) and eating.  She states that the pain is not exertional in nature.  She denies any no suspicious food intake, but reports that she did eat a heavy meal yesterday..  No associated nausea or vomiting.  She is endorsing intermittent shortness of breath.  Denies any orthopnea or constant dyspnea on exertion.  She denies any diarrhea.  States that she is still having bowel movements and passing gas.  HPI  Past Medical History Past Medical History:  Diagnosis Date  . Asthma   . Pulmonary mycobacteria (HCC) 2018   Patient Active Problem List   Diagnosis Date Noted  . Pregnant 12/08/2018   Home Medication(s) Prior to Admission medications   Medication Sig Start Date End Date Taking? Authorizing Provider  albuterol (PROVENTIL) (2.5 MG/3ML) 0.083% nebulizer solution Inhale 2.5 mg into the lungs every 6 (six) hours as needed.  01/04/14 02/11/19  [provider]  ferrous sulfate 325 (65 FE) MG tablet Take 325 mg by mouth daily with breakfast.    [provider]  mometasone (NASONEX) 50 MCG/ACT nasal spray 2 sprays by Each Nare route Two (2) times a day. 02/07/15   [provider]  Omega-3 Fatty Acids (FISH OIL) 1200 MG CAPS Take 2 capsules by mouth daily.    [provider]  Prenatal Vit-Fe Fumarate-FA (PRENATAL MULTIVITAMIN) TABS tablet Take 1 tablet by mouth daily at 12 noon.    [provider]                                                                                                  Past Surgical History Past Surgical History:  Procedure Laterality Date  . BREAST BIOPSY    . SEPTOPLASTY  2003  . WISDOM TOOTH EXTRACTION     Family History Family History  Problem Relation Age of Onset  . Rheum arthritis Mother   . Asthma Mother   . Arthritis/Rheumatoid Mother   . Heart disease Father   . Hypertension Father   . Bipolar disorder Brother   . Schizophrenia Brother   . Diabetes Brother   . Breast cancer Maternal Aunt   . Hypertension Paternal Grandfather   . Diabetes Paternal Grandfather   . Lung cancer Maternal Grandmother     Social History Social History   Tobacco Use  . Smoking status: Former Smoker    Types: Cigarettes  . Smokeless tobacco: Never Used  Substance Use Topics  . Alcohol use: Not Currently  . Drug use: Never   Allergies Penicillins and Citalopram  Review of Systems Review of Systems All other systems are reviewed and are negative for  acute change except as noted in the HPI  Physical Exam Vital Signs  I have reviewed the triage vital signs BP 139/76   Pulse (!) 50   Temp 98.4 F (36.9 C) (Oral)   Resp 16   SpO2 98%   Physical Exam Vitals signs reviewed.  Constitutional:      General: She is not in acute distress.    Appearance: She is well-developed. She is not diaphoretic.  HENT:     Head: Normocephalic and atraumatic.     Nose: Nose normal.  Eyes:     General: No scleral icterus.       Right eye: No discharge.        Left eye: No discharge.     Conjunctiva/sclera: Conjunctivae normal.     Pupils: Pupils are equal, round, and reactive to light.  Neck:     Musculoskeletal: Normal range of motion and neck supple.  Cardiovascular:     Rate and Rhythm: Regular rhythm. Bradycardia present.     Heart sounds: No murmur. No friction rub. No gallop.   Pulmonary:     Effort: Pulmonary effort is normal. No respiratory distress.       Breath sounds: Normal breath sounds. No stridor. No rales.  Abdominal:     General: There is no distension.     Palpations: Abdomen is soft.     Tenderness: There is abdominal tenderness in the epigastric area. There is no guarding or rebound.  Musculoskeletal:        General: No tenderness.     Comments: No obvious peripheral edema noted on exam.  Skin:    General: Skin is warm and dry.     Findings: No erythema or rash.  Neurological:     Mental Status: She is alert and oriented to person, place, and time.     ED Results and Treatments Labs (all labs ordered are listed, but only abnormal results are displayed) Labs Reviewed  URINALYSIS, ROUTINE W REFLEX MICROSCOPIC - Abnormal; Notable for the following components:      Result Value   Color, Urine COLORLESS (*)    Specific Gravity, Urine 1.002 (*)    Hgb urine dipstick LARGE (*)    All other components within normal limits  CBC - Abnormal; Notable for the following components:   RBC 3.49 (*)    Hemoglobin 11.0 (*)    HCT 32.8 (*)    All other components within normal limits  COMPREHENSIVE METABOLIC PANEL - Abnormal; Notable for the following components:   Sodium 134 (*)    CO2 20 (*)    Calcium 8.3 (*)    Total Protein 5.3 (*)    Albumin 2.8 (*)    All other components within normal limits  BRAIN NATRIURETIC PEPTIDE - Abnormal; Notable for the following components:   B Natriuretic Peptide 206.2 (*)    All other components within normal limits  TROPONIN I  PROTEIN / CREATININE RATIO, URINE  RAPID URINE DRUG SCREEN, HOSP PERFORMED  LIPASE, BLOOD  EKG  EKG Interpretation  Date/Time: 12/12/2018  19:47:11   Ventricular Rate: 44   PR Interval: 146   QRS Duration:96   QT Interval: 440   QTC Calculation: 376   R Axis:  66   Text Interpretation: Sinus bradycardia.  Possible left atrial  enlargement. No STEMI.  No old tracings for comparison.        Radiology Dg Chest 2 View  Result Date: 12/12/2018 CLINICAL DATA:  Chest pain since 4 o'clock this morning. Bradycardia. Upper abdominal tenderness. EXAM: CHEST - 2 VIEW COMPARISON:  None. FINDINGS: Normal heart, mediastinum and hila Clear lungs.  No pleural effusion or pneumothorax. Skeletal structures are unremarkable. IMPRESSION: Normal chest radiographs. Electronically Signed   By: Amie Portlandavid  Ormond M.D.   On: 12/12/2018 20:48   Pertinent labs & imaging results that were available during my care of the patient were reviewed by me and considered in my medical decision making (see chart for details).  Medications Ordered in ED Medications  alum & mag hydroxide-simeth (MAALOX/MYLANTA) 200-200-20 MG/5ML suspension 30 mL (30 mLs Oral Given 12/13/18 0030)                                                                                                                                    Procedures Procedures   EMERGENCY DEPARTMENT US CARDIAC EXAM "Study: Limited Ultrasound of the Heart and Pericardium"  INDICATIONS:Abnormal vital signs and epigastric pain Multiple views of the heart and pericardium were obtained in real-time with a multi-frequency probe.  PERFORMED ZO:XWRUEABY:Myself IMAGES ARCHIVED?: Yes LIMITATIONS:  None VIEWS USED: Subcostal 4 chamber, Parasternal long axis and Parasternal short axis INTERPRETATION: Cardiac activity present, Pericardial effusioin absent and Normal contractility   (including critical care time)  Medical Decision Making / ED Course I have reviewed the nursing notes for this encounter and the patient's prior records (if available in EHR or on provided paperwork).    EKG with nonspecific T wave changes.  No prior EKG for comparison.  No evidence of pericarditis.  Troponin negative.  Given the duration of the patient's symptoms, feel this is sufficient to rule out ACS.  BNP at [facility]U was noted to be mildly  elevated just over 200.  Chest x-ray without evidence of cardiomegaly or pulmonary embolism.  Patient does not appear to be volume overloaded that would be concerning for heart failure.  Bedside ultrasound was performed and patient had good symmetric contractility without RV dilatation.  No pericardial effusion.  Doubt cardiomyopathy.  Doubt PE.   Patient is noted to have bradycardia but is not symptomatic.  Labs were inconsistent with preeclampsia.  Her hypertension is likely transient postpartum hypertension.  No evidence of endorgan damage noted.  Recommended OB/GYN follow-up.  No need for intervention at this time.   No leukocytosis; stable hemoglobin.  No significant electrolyte derangements or renal insufficiency.  No evidence of biliary obstruction or pancreatitis.  Doubt serious intra-abdominal inflammatory/infectious process or  bowel obstruction requiring imaging at this time.  Patient given GI cocktail for symptomatic relief.  The patient appears reasonably screened and/or stabilized for discharge and I doubt any other medical condition or other [facility] requiring further screening, evaluation, or treatment in the ED at this time prior to discharge.  The patient is safe for discharge with strict return precautions.   Final Clinical Impression(s) / ED Diagnoses Final diagnoses:  Bradycardia  Hypertension, unspecified type  Epigastric pain    Disposition: Discharge  Condition: Good  I have discussed the results, Dx and Tx plan with the patient who expressed understanding and agree(s) with the plan. Discharge instructions discussed at great length. The patient was given strict return precautions who verbalized understanding of the instructions. No further questions at time of discharge.    ED Discharge Orders    None       Follow Up: OB/Gyn  Schedule an appointment as soon as possible for a visit  For close follow up to assess for postpartum hypertension  Noorani, Hinton DyerSezmin S,  MD [address] [place] [address] [phone]  Schedule an appointment as soon as possible for a visit  As needed, If symptoms do not improve or  worsen       This chart was dictated using voice recognition software.  Despite best efforts to proofread,  errors can occur which can change the documentation meaning.   Nira Connardama, Erroll Wilbourne Eduardo, MD 12/13/18 [phone]

## 2018-12-12 NOTE — MAU Provider Note (Addendum)
Chief Complaint: Hypertension   First Provider Initiated Contact with Patient 12/12/18 1915      SUBJECTIVE HPI: Kelsey Griffith is a 43 y.o. [id number] who is s/p NSVD on 12/08/18 who presents to maternity admissions reporting epigastric pain, intermittent chills, hand and face edema, and hypertension today. Her epigastric pain was mild a few days ago but is worse today, constant pressure/sharp pain with some burning like heartburn also.  It is associated with cough.  She had an episode this morning at 4 am when she was up with the baby and felt chills, cold and weak. She had to lie down and was too weak to go get her husband in another room.  She reports she woke up this morning and her color was unusual, paler than usual.  She reports some hand and face edema this morning.  She took her BP at the pediatrician's office today and it was 140s/90s.  She called her OB/gyn office and was sent to [facility]U for evaluation.  She has not tried any treatments. There are no other symptoms. She reports that as a runner, her heart rate is sometimes in the high 50s but she usually runs in the 60s.  During pregnancy her heart rate has been in the 80s most often per the pt. She denies any cardiac hx.   HPI  Past Medical History:  Diagnosis Date  . Asthma   . Pulmonary mycobacteria (HCC) 2018   Past Surgical History:  Procedure Laterality Date  . BREAST BIOPSY    . SEPTOPLASTY  2003  . WISDOM TOOTH EXTRACTION     Social History   Socioeconomic History  . Marital status: Married    Spouse name: Not on file  . Number of children: Not on file  . Years of education: Not on file  . Highest education level: Not on file  Occupational History  . Not on file  Social Needs  . Financial resource strain: Not hard at all  . Food insecurity:    Worry: Never true    Inability: Never true  . Transportation needs:    Medical: No    Non-medical: Not on file  Tobacco Use  . Smoking status: Former Smoker    Types:  Cigarettes  . Smokeless tobacco: Never Used  Substance and Sexual Activity  . Alcohol use: Not Currently  . Drug use: Never  . Sexual activity: Yes  Lifestyle  . Physical activity:    Days per week: Not on file    Minutes per session: Not on file  . Stress: Rather much  Relationships  . Social connections:    Talks on phone: Not on file    Gets together: Not on file    Attends religious service: Not on file    Active member of club or organization: Not on file    Attends meetings of clubs or organizations: Not on file    Relationship status: Not on file  . Intimate partner violence:    Fear of current or ex partner: No    Emotionally abused: No    Physically abused: No    Forced sexual activity: No  Other Topics Concern  . Not on file  Social History Narrative  . Not on file   No current facility-administered medications on file prior to encounter.    Current Outpatient Medications on File Prior to Encounter  Medication Sig Dispense Refill  . albuterol (PROVENTIL) (2.5 MG/3ML) 0.083% nebulizer solution Inhale 2.5 mg into the lungs every  6 (six) hours as needed.     . ferrous sulfate 325 (65 FE) MG tablet Take 325 mg by mouth daily with breakfast.    . mometasone (NASONEX) 50 MCG/ACT nasal spray 2 sprays by Each Nare route Two (2) times a day.    . Omega-3 Fatty Acids (FISH OIL) 1200 MG CAPS Take 2 capsules by mouth daily.    . Prenatal Vit-Fe Fumarate-FA (PRENATAL MULTIVITAMIN) TABS tablet Take 1 tablet by mouth daily at 12 noon.     Allergies  Allergen Reactions  . Penicillins Anaphylaxis and Swelling    Has patient had a PCN reaction causing immediate rash, facial/tongue/throat swelling, SOB or lightheadedness with hypotension: Yes Has patient had a PCN reaction causing severe rash involving mucus membranes or skin necrosis: No Has patient had a PCN reaction that required hospitalization: No Has patient had a PCN reaction occurring within the last 10 years: No If all  of the above answers are "NO", then may proceed with Cephalosporin use.   . Citalopram Other (See Comments) and Rash    RASH and sedation sedation RASH and sedation     ROS:  Review of Systems  Constitutional: Positive for chills and diaphoresis. Negative for fatigue and fever.  Respiratory: Positive for cough. Negative for shortness of breath.   Cardiovascular: Negative for chest pain.  Gastrointestinal: Positive for abdominal pain. Negative for nausea and vomiting.  Genitourinary: Positive for vaginal bleeding. Negative for difficulty urinating, dysuria, flank pain, pelvic pain, vaginal discharge and vaginal pain.  Skin: Positive for color change and pallor.  Neurological: Negative for dizziness and headaches.  Psychiatric/Behavioral: Negative.      I have reviewed patient's Past Medical Hx, Surgical Hx, Family Hx, Social Hx, medications and allergies.   Physical Exam   Patient Vitals for the past 24 hrs:  BP Pulse Resp SpO2  12/12/18 2155 - - - 99 %  12/12/18 2150 - - - 98 %  12/12/18 2125 - - - 99 %  12/12/18 2120 - - - 99 %  12/12/18 2116 127/83 (!) 47 - -  12/12/18 2115 - - - 97 %  12/12/18 2114 133/83 (!) 43 - -  12/12/18 2110 - - - 99 %  12/12/18 2100 - - - 99 %  12/12/18 2056 136/85 (!) 45 - -  12/12/18 2055 136/85 (!) 44 16 -  12/12/18 1907 136/79 (!) 44 16 -   .  Physical Exam  Constitutional: She is oriented to person, place, and time and well-developed, well-nourished, and in no distress.  HENT:  Head: Normocephalic.  Eyes: Pupils are equal, round, and reactive to light. Conjunctivae are normal.  Neck: Normal range of motion.  Cardiovascular: Regular rhythm and normal heart sounds.  Bradycardia   Pulmonary/Chest: Effort normal and breath sounds normal.  Abdominal: Soft. Bowel sounds are normal. She exhibits no distension, no abdominal bruit and no mass. There is abdominal tenderness in the epigastric area. There is guarding. There is no rigidity, no  rebound and no CVA tenderness.  Musculoskeletal: Normal range of motion.     Comments: Mild BUE and BLE edema, +1 nonpitting   Neurological: She is alert and oriented to person, place, and time.  Skin: Skin is warm and dry.  Psychiatric: Memory, affect and judgment normal.     LAB RESULTS Results for orders placed or performed during the hospital encounter of 12/12/18 (from the past 24 hour(s))  Urine rapid drug screen (hosp performed)     Status: None  Collection Time: 12/12/18  6:04 PM  Result Value Ref Range   Opiates NONE DETECTED NONE DETECTED   Cocaine NONE DETECTED NONE DETECTED   Benzodiazepines NONE DETECTED NONE DETECTED   Amphetamines NONE DETECTED NONE DETECTED   Tetrahydrocannabinol NONE DETECTED NONE DETECTED   Barbiturates NONE DETECTED NONE DETECTED  Urinalysis, Routine w reflex microscopic     Status: Abnormal   Collection Time: 12/12/18  6:05 PM  Result Value Ref Range   Color, Urine COLORLESS (A) YELLOW   APPearance CLEAR CLEAR   Specific Gravity, Urine 1.002 (L) 1.005 - 1.030   pH 8.0 5.0 - 8.0   Glucose, UA NEGATIVE NEGATIVE mg/dL   Hgb urine dipstick LARGE (A) NEGATIVE   Bilirubin Urine NEGATIVE NEGATIVE   Ketones, ur NEGATIVE NEGATIVE mg/dL   Protein, ur NEGATIVE NEGATIVE mg/dL   Nitrite NEGATIVE NEGATIVE   Leukocytes, UA NEGATIVE NEGATIVE   RBC / HPF 0-5 0 - 5 RBC/hpf   WBC, UA 0-5 0 - 5 WBC/hpf   Bacteria, UA NONE SEEN NONE SEEN   Squamous Epithelial / LPF 0-5 0 - 5   Mucus PRESENT   Protein / creatinine ratio, urine     Status: None   Collection Time: 12/12/18  6:05 PM  Result Value Ref Range   Creatinine, Urine 19.00 mg/dL   Total Protein, Urine <6 mg/dL   Protein Creatinine Ratio        0.00 - 0.15 mg/mg[Cre]  CBC     Status: Abnormal   Collection Time: 12/12/18  7:30 PM  Result Value Ref Range   WBC 6.9 4.0 - 10.5 K/uL   RBC 3.49 (L) 3.87 - 5.11 MIL/uL   Hemoglobin 11.0 (L) 12.0 - 15.0 g/dL   HCT 24.4 (L) 01.0 - 27.2 %   MCV 94.0 80.0  - 100.0 fL   MCH 31.5 26.0 - 34.0 pg   MCHC 33.5 30.0 - 36.0 g/dL   RDW 53.6 64.4 - 03.4 %   Platelets 241 150 - 400 K/uL   nRBC 0.0 0.0 - 0.2 %  Comprehensive metabolic panel     Status: Abnormal   Collection Time: 12/12/18  7:30 PM  Result Value Ref Range   Sodium 134 (L) 135 - 145 mmol/L   Potassium 3.8 3.5 - 5.1 mmol/L   Chloride 109 98 - 111 mmol/L   CO2 20 (L) 22 - 32 mmol/L   Glucose, Bld 83 70 - 99 mg/dL   BUN 7 6 - 20 mg/dL   Creatinine, Ser 7.42 0.44 - 1.00 mg/dL   Calcium 8.3 (L) 8.9 - 10.3 mg/dL   Total Protein 5.3 (L) 6.5 - 8.1 g/dL   Albumin 2.8 (L) 3.5 - 5.0 g/dL   AST 41 15 - 41 U/L   ALT 33 0 - 44 U/L   Alkaline Phosphatase 101 38 - 126 U/L   Total Bilirubin 0.3 0.3 - 1.2 mg/dL   GFR calc non Af Amer >60 >60 mL/min   GFR calc Af Amer >60 >60 mL/min   Anion gap 5 5 - 15  Troponin I - Once     Status: None   Collection Time: 12/12/18  7:30 PM  Result Value Ref Range   Troponin I <0.03 <0.03 ng/mL  Brain natriuretic peptide     Status: Abnormal   Collection Time: 12/12/18  7:30 PM  Result Value Ref Range   B Natriuretic Peptide 206.2 (H) 0.0 - 100.0 pg/mL    --/--/O POS, O POS Performed at  [facility], [address] [place], [place] [address]  [phone](12/31 0041)  IMAGING Dg Chest 2 View  Result Date: 12/12/2018 CLINICAL DATA:  Chest pain since 4 o'clock this morning. Bradycardia. Upper abdominal tenderness. EXAM: CHEST - 2 VIEW COMPARISON:  None. FINDINGS: Normal heart, mediastinum and hila Clear lungs.  No pleural effusion or pneumothorax. Skeletal structures are unremarkable. IMPRESSION: Normal chest radiographs. Electronically Signed   By: Amie Portlandavid  Ormond M.D.   On: 12/12/2018 20:48    MAU Management/MDM: Orders Placed This Encounter  Procedures  . DG Chest 2 View  . Urinalysis, Routine w reflex microscopic  . CBC  . Comprehensive metabolic panel  . Troponin I - Once  . Brain natriuretic peptide  . Protein / creatinine ratio, urine  . Urine rapid  drug screen (hosp performed)  . Lipase, blood  . ED EKG    No orders of the defined types were placed in this encounter.  Consult Dr Macon LargeAnyanwu with assessment and findings.  See labs/orders.  EKG with sinus bradycardia.  CXR and labs pending. Report to Steward DroneVeronica Rogers, CNM.  ASSESSMENT 1. Bradycardia   2. Chest pain      Sharen CounterLisa Leftwich-Kirby Certified Nurse-Midwife   Attestation of Attending Supervision of Advanced Practice Provider (PA/CNM/NP): Evaluation and management procedures were performed by the Advanced Practice Provider under my supervision and collaboration.  I have reviewed the Advanced Practice Provider's note and chart, and I agree with the management and plan.  Patient Vitals for the past 24 hrs:  BP Pulse Resp SpO2  12/12/18 2155 - - - 99 %  12/12/18 2150 - - - 98 %  12/12/18 2146 137/82 (!) 47 - -  12/12/18 2145 - - - 97 %  12/12/18 2125 - - - 99 %  12/12/18 2120 - - - 99 %  12/12/18 2116 127/83 (!) 47 - -  12/12/18 2115 - - - 97 %  12/12/18 2114 133/83 (!) 43 - -  12/12/18 2110 - - - 99 %  12/12/18 2100 - - - 99 %  12/12/18 2056 136/85 (!) 45 - -  12/12/18 2055 136/85 (!) 44 16 -  12/12/18 1907 136/79 (!) 44 16 -    Negative troponin but elevated BNP. CXR neg. CMP and CBC unremarkable, lipase pending at time of this addendum. Patient still feels the chest heaviness and lightheadedness, does not feel better. No obstetric issues at this point.  Given possibly cardiac etiology of her symptoms, recommended transfer and her covering obstetrician (Dr. Rana SnareLowe) was notified and he agreed with this plan.  [facility] and talked to Dr. Blane OharaJoshua Zavitz, he accepted [facility]. [facility].   Jaynie CollinsUGONNA  Brennon Otterness, MD, FACOG Obstetrician & Gynecologist, [facility], [facility]

## 2018-12-13 MED ORDER — ALUM & MAG HYDROXIDE-SIMETH 200-200-20 MG/5ML PO SUSP
30.0000 mL | Freq: Once | ORAL | Status: AC
  Administered 2018-12-13: 30 mL via ORAL
  Filled 2018-12-13: qty 30

## 2018-12-13 NOTE — ED Notes (Signed)
Pt discharged from ED; instructions provided; Pt encouraged to return to ED if symptoms worsen and to f/u with PCP; Pt verbalized understanding of all instructions 

## 2018-12-13 NOTE — Discharge Instructions (Signed)
Abdominal (belly) pain can be caused by many things. Your caregiver performed an examination and possibly ordered blood/urine tests and imaging (CT scan, x-rays, ultrasound). Many cases can be observed and treated at home after initial evaluation in the emergency department. Even though you are being discharged home, abdominal pain can be unpredictable. Therefore, you need a repeated exam if your pain does not resolve, returns, or worsens. Most patients with abdominal pain don't have to be admitted to the hospital or have surgery, but serious problems like appendicitis and gallbladder attacks can start out as nonspecific pain. Many abdominal conditions cannot be diagnosed in one visit, so follow-up evaluations are very important. °SEEK IMMEDIATE MEDICAL ATTENTION IF: °The pain does not go away or becomes severe.  °A temperature above 101 develops.  °Repeated vomiting occurs (multiple episodes).  °The pain becomes localized to portions of the abdomen. The right side could possibly be appendicitis. In an adult, the left lower portion of the abdomen could be colitis or diverticulitis.  °Blood is being passed in stools or vomit (bright red or Eilers tarry stools).  °Return also if you develop chest pain, difficulty breathing, dizziness or fainting, or become confused, poorly responsive, or inconsolable (young children). ° °

## 2018-12-14 ENCOUNTER — Emergency Department (HOSPITAL_COMMUNITY)
Admission: EM | Admit: 2018-12-14 | Discharge: 2018-12-14 | Disposition: A | Discharge status: Home / Self Care | Payer: Managed Care, Other (non HMO) | Attending: Emergency Medicine | Admitting: Emergency Medicine

## 2018-12-14 ENCOUNTER — Emergency Department (HOSPITAL_COMMUNITY): Payer: Managed Care, Other (non HMO)

## 2018-12-14 DIAGNOSIS — R0789 Other chest pain: Secondary | ICD-10-CM | POA: Diagnosis not present

## 2018-12-14 DIAGNOSIS — R51 Headache: Secondary | ICD-10-CM | POA: Insufficient documentation

## 2018-12-14 DIAGNOSIS — H5711 Ocular pain, right eye: Secondary | ICD-10-CM | POA: Insufficient documentation

## 2018-12-14 DIAGNOSIS — Z87891 Personal history of nicotine dependence: Secondary | ICD-10-CM | POA: Insufficient documentation

## 2018-12-14 DIAGNOSIS — R030 Elevated blood-pressure reading, without diagnosis of hypertension: Secondary | ICD-10-CM | POA: Diagnosis not present

## 2018-12-14 DIAGNOSIS — Z79899 Other long term (current) drug therapy: Secondary | ICD-10-CM | POA: Insufficient documentation

## 2018-12-14 DIAGNOSIS — R0602 Shortness of breath: Secondary | ICD-10-CM | POA: Insufficient documentation

## 2018-12-14 DIAGNOSIS — H539 Unspecified visual disturbance: Secondary | ICD-10-CM | POA: Diagnosis present

## 2018-12-14 DIAGNOSIS — R1013 Epigastric pain: Secondary | ICD-10-CM | POA: Diagnosis not present

## 2018-12-14 LAB — DIFFERENTIAL
Abs Immature Granulocytes: 0.05 10*3/uL (ref 0.00–0.07)
Basophils Absolute: 0 10*3/uL (ref 0.0–0.1)
Basophils Relative: 0 %
EOS PCT: 2 %
Eosinophils Absolute: 0.2 10*3/uL (ref 0.0–0.5)
Immature Granulocytes: 1 %
Lymphocytes Relative: 20 %
Lymphs Abs: 1.7 10*3/uL (ref 0.7–4.0)
Monocytes Absolute: 0.5 10*3/uL (ref 0.1–1.0)
Monocytes Relative: 6 %
Neutro Abs: 5.7 10*3/uL (ref 1.7–7.7)
Neutrophils Relative %: 71 %

## 2018-12-14 LAB — URINALYSIS, ROUTINE W REFLEX MICROSCOPIC
Bacteria, UA: NONE SEEN
Bilirubin Urine: NEGATIVE
Glucose, UA: NEGATIVE mg/dL
Ketones, ur: 5 mg/dL — AB
Leukocytes, UA: NEGATIVE
Nitrite: NEGATIVE
Protein, ur: NEGATIVE mg/dL
SPECIFIC GRAVITY, URINE: 1.003 — AB (ref 1.005–1.030)
pH: 8 (ref 5.0–8.0)

## 2018-12-14 LAB — COMPREHENSIVE METABOLIC PANEL
ALT: 37 U/L (ref 0–44)
ANION GAP: 10 (ref 5–15)
AST: 33 U/L (ref 15–41)
Albumin: 3.1 g/dL — ABNORMAL LOW (ref 3.5–5.0)
Alkaline Phosphatase: 94 U/L (ref 38–126)
BUN: 10 mg/dL (ref 6–20)
CO2: 22 mmol/L (ref 22–32)
Calcium: 9.1 mg/dL (ref 8.9–10.3)
Chloride: 105 mmol/L (ref 98–111)
Creatinine, Ser: 0.77 mg/dL (ref 0.44–1.00)
GFR calc non Af Amer: >60 mL/min (ref >60)
Glucose, Bld: 93 mg/dL (ref 70–99)
Potassium: 3.6 mmol/L (ref 3.5–5.1)
Sodium: 137 mmol/L (ref 135–145)
Total Bilirubin: 0.7 mg/dL (ref 0.3–1.2)
Total Protein: 5.5 g/dL — ABNORMAL LOW (ref 6.5–8.1)

## 2018-12-14 LAB — CBC
HCT: 34.8 % — ABNORMAL LOW (ref 36.0–46.0)
Hemoglobin: 11.3 g/dL — ABNORMAL LOW (ref 12.0–15.0)
MCH: 31.1 pg (ref 26.0–34.0)
MCHC: 32.5 g/dL (ref 30.0–36.0)
MCV: 95.9 fL (ref 80.0–100.0)
Platelets: 302 10*3/uL (ref 150–400)
RBC: 3.63 MIL/uL — ABNORMAL LOW (ref 3.87–5.11)
RDW: 13.2 % (ref 11.5–15.5)
WBC: 8.1 10*3/uL (ref 4.0–10.5)
nRBC: 0 % (ref 0.0–0.2)

## 2018-12-14 LAB — I-STAT TROPONIN, ED: TROPONIN I, POC: 0 ng/mL (ref 0.00–0.08)

## 2018-12-14 LAB — I-STAT CHEM 8, ED
BUN: 10 mg/dL (ref 6–20)
CALCIUM ION: 1.25 mmol/L (ref 1.15–1.40)
Chloride: 107 mmol/L (ref 98–111)
Creatinine, Ser: 0.6 mg/dL (ref 0.44–1.00)
Glucose, Bld: 89 mg/dL (ref 70–99)
HCT: 34 % — ABNORMAL LOW (ref 36.0–46.0)
Hemoglobin: 11.6 g/dL — ABNORMAL LOW (ref 12.0–15.0)
Potassium: 3.6 mmol/L (ref 3.5–5.1)
SODIUM: 138 mmol/L (ref 135–145)
TCO2: 24 mmol/L (ref 22–32)

## 2018-12-14 LAB — APTT: aPTT: 29 seconds (ref 24–36)

## 2018-12-14 LAB — PROTIME-INR
INR: 0.97
Prothrombin Time: 12.7 seconds (ref 11.4–15.2)

## 2018-12-14 MED ORDER — NIFEDIPINE ER OSMOTIC RELEASE 30 MG PO TB24: 30.0000 mg | ORAL_TABLET | Freq: Every day | ORAL | 0 refills | Status: DC

## 2018-12-14 NOTE — ED Triage Notes (Signed)
Pt in c/o episode of vision changes this morning, pt is 6 days post partum, also had abdominal pain, chest pain and shortness of breath, seen for same on 1/4, reports blurred vision and visual field change that lasted for about 2 hours, resolved at this time, pt reports pressure behind her left eye now and intermittent headache

## 2018-12-14 NOTE — ED Notes (Signed)
Patient transported to MRI 

## 2018-12-14 NOTE — ED Notes (Signed)
Patient transported to CT 

## 2018-12-14 NOTE — ED Provider Notes (Signed)
[facility] EMERGENCY DEPARTMENT Provider Note   CSN: [id number] Arrival date & time: 12/14/18  1314     History   Chief Complaint Chief Complaint  Patient presents with  . Eye Problem    HPI Kelsey Griffith is a 43 y.o. female who presents with vision change. PMH significant for asthma, hx of MAC, bronchiectasis. She states that her initial symptoms started with rigors a couple days ago. She is 6 days post-partum and had a normal vaginal delivery. This morning she states that she had an episode that lasted about 10 seconds in her right lower visual field that looked like a rectangle and like she was looking through frosted glass. This has completely resolved but she reports residual right eye pressure. She has also had vague, intermittent dull chest pain which comes and goes and and is not with exertion. She has had SOB which also comes and goes. It is sometimes exertional and sometimes not. She has multiple chronic respiratory issues so is unsure if it's from that or not. She had a chronic cough which seems to be worse than normal. She denies fevers. She has epigastric abdominal pain which radiates to the right side of the abdomen. No prior abdominal surgeries. She denies any pain with eating or nausea/vomiting. No dysuria or urinary symptoms. She also feels like her entire body is swollen, particularly in the face and hands. She was seen in the ED for this problem 2 days ago. Her BNP was noted to be elevated but she had a normal CXR and bedside echocardiogram was performed which was normal. Overall she feels slightly better because she has been able to rest more but the vision change today bothered her. She is also concerned her blood pressure is elevated - it's running in the 140s/90s and is typically 90/60.   HPI  Past Medical History:  Diagnosis Date  . Asthma   . 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 Pregnant  12/08/2018    Past Surgical History:  Procedure Laterality Date  . BREAST BIOPSY    . SEPTOPLASTY  2003  . WISDOM TOOTH EXTRACTION       OB History    Gravida  4   Para  3   Term  3   Preterm      AB  1   Living  3     SAB      TAB  1   Ectopic      Multiple  0   Live Births  3            Home Medications    Prior to Admission medications   Medication Sig Start Date End Date Taking? Authorizing Provider  albuterol (PROVENTIL) (2.5 MG/3ML) 0.083% nebulizer solution Inhale 2.5 mg into the lungs every 6 (six) hours as needed.  01/04/14 02/11/19  [provider]  ferrous sulfate 325 (65 FE) MG tablet Take 325 mg by mouth daily with breakfast.    [provider]  mometasone (NASONEX) 50 MCG/ACT nasal spray 2 sprays by Each Nare route Two (2) times a day. 02/07/15   [provider]  Omega-3 Fatty Acids (FISH OIL) 1200 MG CAPS Take 2 capsules by mouth daily.    [provider]  Prenatal Vit-Fe Fumarate-FA (PRENATAL MULTIVITAMIN) TABS tablet Take 1 tablet by mouth daily at 12 noon.    [provider]    Family History Family  History  Problem Relation Age of Onset  . Rheum arthritis Mother   . Asthma Mother   . Arthritis/Rheumatoid Mother   . Heart disease Father   . Hypertension Father   . Bipolar disorder Brother   . Schizophrenia Brother   . Diabetes Brother   . Breast cancer Maternal Aunt   . Hypertension Paternal Grandfather   . Diabetes Paternal Grandfather   . Lung cancer Maternal Grandmother     Social History Social History   Tobacco Use  . Smoking status: Former Smoker    Types: Cigarettes  . Smokeless tobacco: Never Used  Substance Use Topics  . Alcohol use: Not Currently  . Drug use: Never     Allergies   Penicillins and Citalopram   Review of Systems Review of Systems  Constitutional: Positive for chills and fatigue. Negative for fever.  Eyes: Positive for pain and visual disturbance.    Respiratory: Positive for cough and shortness of breath. Negative for wheezing.   Cardiovascular: Positive for chest pain.  Gastrointestinal: Positive for abdominal pain. Negative for nausea and vomiting.  Genitourinary: Positive for vaginal bleeding. Negative for dysuria.  Musculoskeletal: Negative for back pain.  Neurological: Positive for headaches. Negative for seizures, syncope, weakness, light-headedness and numbness.  All other systems reviewed and are negative.    Physical Exam Updated Vital Signs BP (!) 148/92 (BP Location: Right Arm)   Pulse (!) 52   Temp 98.9 F (37.2 C) (Oral)   Resp 20   SpO2 100%   Physical Exam Vitals signs and nursing note reviewed.  Constitutional:      General: She is not in acute distress.    Appearance: Normal appearance. She is well-developed.     Comments: Calm and cooperative. No significant appreciable edema  HENT:     Head: Normocephalic and atraumatic.  Eyes:     General: No scleral icterus.       Right eye: No discharge.        Left eye: No discharge.     Extraocular Movements: Extraocular movements intact.     Conjunctiva/sclera: Conjunctivae normal.     Pupils: Pupils are equal, round, and reactive to light.     Comments: Visual Acuity  Right Eye Distance: 20/16(with glasses) Left Eye Distance: 20/12.5(with glasses) Bilateral Distance: 20/16(with glasses)      Neck:     Musculoskeletal: Normal range of motion.  Cardiovascular:     Rate and Rhythm: Regular rhythm. Bradycardia present.  Pulmonary:     Effort: Pulmonary effort is normal. No respiratory distress.     Breath sounds: Normal breath sounds.  Abdominal:     General: There is distension (post-partum state).     Tenderness: There is abdominal tenderness (epigastric).  Skin:    General: Skin is warm and dry.  Neurological:     Mental Status: She is alert and oriented to person, place, and time.  Psychiatric:        Behavior: Behavior normal.      ED  Treatments / Results  Labs (all labs ordered are listed, but only abnormal results are displayed) Labs Reviewed  CBC - Abnormal; Notable for the following components:      Result Value   RBC 3.63 (*)    Hemoglobin 11.3 (*)    HCT 34.8 (*)    All other components within normal limits  COMPREHENSIVE METABOLIC PANEL - Abnormal; Notable for the following components:   Total Protein 5.5 (*)    Albumin 3.1 (*)  All other components within normal limits  URINALYSIS, ROUTINE W REFLEX MICROSCOPIC - Abnormal; Notable for the following components:   Color, Urine STRAW (*)    Specific Gravity, Urine 1.003 (*)    Hgb urine dipstick LARGE (*)    Ketones, ur 5 (*)    All other components within normal limits  I-STAT CHEM 8, ED - Abnormal; Notable for the following components:   Hemoglobin 11.6 (*)    HCT 34.0 (*)    All other components within normal limits  PROTIME-INR  APTT  DIFFERENTIAL  I-STAT TROPONIN, ED  CBG MONITORING, ED    EKG EKG Interpretation  Date/Time:  Monday December 14 2018 13:27:46 EST Ventricular Rate:  54 PR Interval:  144 QRS Duration: 80 QT Interval:  418 QTC Calculation: 396 R Axis:   79 Text Interpretation:  Sinus bradycardia Otherwise normal ECG no significant change since Dec 12 2018 Confirmed by Sherwood Gambler [phone]) on 12/14/2018 2:22:15 PM   Radiology Dg Chest 2 View  Result Date: 12/14/2018 CLINICAL DATA:  Cough and shortness of breath EXAM: CHEST - 2 VIEW COMPARISON:  None. FINDINGS: Lungs are clear. Heart size and pulmonary vascularity are normal. No adenopathy. No bone lesions. IMPRESSION: No edema or consolidation. Electronically Signed   By: Lowella Grip III M.D.   On: 12/14/2018 15:51   Dg Chest 2 View  Result Date: 12/12/2018 CLINICAL DATA:  Chest pain since 4 o'clock this morning. Bradycardia. Upper abdominal tenderness. EXAM: CHEST - 2 VIEW COMPARISON:  None. FINDINGS: Normal heart, mediastinum and hila Clear lungs.  No pleural effusion  or pneumothorax. Skeletal structures are unremarkable. IMPRESSION: Normal chest radiographs. Electronically Signed   By: Lajean Manes M.D.   On: 12/12/2018 20:48   Ct Head Wo Contrast  Result Date: 12/14/2018 CLINICAL DATA:  Focal neuro deficit. Vision changes this morning. Six days postpartum. EXAM: CT HEAD WITHOUT CONTRAST TECHNIQUE: Contiguous axial images were obtained from the base of the skull through the vertex without intravenous contrast. COMPARISON:  None. FINDINGS: Brain: No evidence of acute infarction, hemorrhage, hydrocephalus, extra-axial collection or mass lesion/mass effect. Vascular: No hyperdense vessel or unexpected calcification. Skull: Normal. Negative for fracture or focal lesion. Sinuses/Orbits: Minimal mucosal disease in the left sphenoid sinus. Other: None. IMPRESSION: No acute intracranial abnormality. Electronically Signed   By: Markus Daft M.D.   On: 12/14/2018 14:56   Mr Brain Wo Contrast  Result Date: 12/14/2018 CLINICAL DATA:  Acute vision loss.  Six days postpartum. EXAM: MRI HEAD WITHOUT CONTRAST MRV HEAD WITHOUT CONTRAST TECHNIQUE: Multiplanar, multiecho pulse sequences of the brain and surrounding structures were obtained without intravenous contrast. Angiographic images of the intracranial venous structures were obtained using MRV technique without intravenous contrast. COMPARISON:  CT head 12/14/2018 FINDINGS: MR head: Ventricle size and cerebral volume normal. Negative for acute infarct. Scattered tiny subcortical white matter hyperintensities bilaterally. Brainstem and cerebellum normal. Negative for hemorrhage or mass. Normal arterial flow voids.  Normal skull. Paranasal sinuses clear.  Normal orbit. MRV head: Superior sagittal sinus appears normal. Right transverse sinus is widely patent and dominant. Right sigmoid sinus and jugular vein are dominant and widely patent. Left transverse sinus is hypoplastic. Left sigmoid sinus and jugular vein are patent but small. No  evidence of acute venous thrombosis. Small left transverse sinus appears congenital without secondary signs of thrombosis. No edema or infarct is present. No hyperdensity in the left transverse sinus on CT. IMPRESSION: Negative for acute infarct.  No acute intracranial abnormality Small chronic white  matter changes which may be due to chronic microvascular ischemia or chronic complex migraine headaches. 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 No evidence of acute venous thrombosis. Electronically Signed   By: Franchot Gallo M.D.   On: 12/14/2018 17:17   Mr Mrv Head Wo Cm  Result Date: 12/14/2018 CLINICAL DATA:  Acute vision loss.  Six days postpartum. EXAM: MRI HEAD WITHOUT CONTRAST MRV HEAD WITHOUT CONTRAST TECHNIQUE: Multiplanar, multiecho pulse sequences of the brain and surrounding structures were obtained without intravenous contrast. Angiographic images of the intracranial venous structures were obtained using MRV technique without intravenous contrast. COMPARISON:  CT head 12/14/2018 FINDINGS: MR head: Ventricle size and cerebral volume normal. Negative for acute infarct. Scattered tiny subcortical white matter hyperintensities bilaterally. Brainstem and cerebellum normal. Negative for hemorrhage or mass. Normal arterial flow voids.  Normal skull. Paranasal sinuses clear.  Normal orbit. MRV head: Superior sagittal sinus appears normal. Right transverse sinus is widely patent and dominant. Right sigmoid sinus and jugular vein are dominant and widely patent. Left transverse sinus is hypoplastic. Left sigmoid sinus and jugular vein are patent but small. No evidence of acute venous thrombosis. Small left transverse sinus appears congenital without secondary signs of thrombosis. No edema or infarct is present. No hyperdensity in the left transverse sinus on CT. IMPRESSION: Negative for acute infarct.  No acute intracranial abnormality Small chronic white matter changes which may  be due to chronic microvascular ischemia or chronic complex migraine headaches. 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 No evidence of acute venous thrombosis. Electronically Signed   By: Franchot Gallo M.D.   On: 12/14/2018 17:17    Procedures Procedures (including critical care time)  Medications Ordered in ED Medications - No data to display   Initial Impression / Assessment and Plan / ED Course  I have reviewed the triage vital signs and the nursing notes.  Pertinent labs & imaging results that were available during my care of the patient were reviewed by me and considered in my medical decision making (see chart for details).  43 year old female presents with 10 seconds of visual field change this morning along with HTN, CP/SOB/Abdominal pain. Her systolic BP is ranging between 314-970 systolic here. She is also notably bradycardic but does not seem to be symptomatic from this. Her main concern today is visual field change. She has been worked up for CP/SOB yesterday and work up was reassuring. Her vision symptoms have resolved at this time. Labs, UA, CT head, EKG ordered.  CBC is remarkable for improving anemia. CMP is remarkable for slightly low protein. UA does not have protein. EKG is sinus bradycardia. Repeat CXR is negative. CT head is negative. Shared visit with Dr. Regenia Skeeter who spoke with Dr. Rory Percy with neurology. He recommends MRI/MRV.   MRI and MRV are negative. Discussed with patient's OBGYN Dr. Royston Sinner. She does not feel patient's symptoms warrant Magnesium and she does not require admission. She recommends starting Nifedipine 58m once daily. She will see her in clinic tomorrow. Results were discussed with patient. She is comfortable with plan.    Final Clinical Impressions(s) / ED Diagnoses   Final diagnoses:  Elevated blood pressure reading    ED Discharge Orders    None       GRecardo Evangelist PA-C 12/14/18 1Mariane Duval MD 12/15/18 [phone]

## 2018-12-14 NOTE — Discharge Instructions (Addendum)
Start Nifedipine 30mg  once daily - return if you become lightheaded like you may pass out Please return to the ED if you have persistent headache, vision changes, SOB, RUQ or epigastric abdominal pain Call Dr. Arlester Marker office tomorrow morning for a follow up appointment

## 2019-01-27 ENCOUNTER — Encounter: Payer: Self-pay | Admitting: Cardiovascular Disease

## 2019-01-27 ENCOUNTER — Ambulatory Visit: Payer: Managed Care, Other (non HMO) | Admitting: Cardiovascular Disease

## 2019-01-27 DIAGNOSIS — R001 Bradycardia, unspecified: Secondary | ICD-10-CM

## 2019-01-27 DIAGNOSIS — O165 Unspecified maternal hypertension, complicating the puerperium: Secondary | ICD-10-CM | POA: Diagnosis not present

## 2019-01-27 DIAGNOSIS — R0789 Other chest pain: Secondary | ICD-10-CM | POA: Diagnosis not present

## 2019-01-27 NOTE — Assessment & Plan Note (Signed)
Was initially treated with nifedipine which she has since discontinued because of hypotension.  She is normotensive today.

## 2019-01-27 NOTE — Progress Notes (Signed)
01/27/2019 Kelsey Griffith   04/10/1976  [id number]  Primary Physician Keane Police, MD Primary Cardiologist: Runell Gess MD Nicholes Calam[place], [place]  HPI:  Sharlette Fernstrom is a 43 y.o. fit appearing married Caucasian female mother of 3 children (daughter is 87 weeks old) who was referred by Dr. Huntley Dec for cardiovascular evaluation because of postpartum bradycardia, hypertension and atypical chest pain.  She moved from [place] to [place] recently because of a job.  She works as a Doctor, general practice at [employer].  She has no cardiac risk factors other than family history with a father that had a myocardial infarction.  She does not smoke.  She has no history of hypertension previously or hyperlipidemia nor she diabetic.  She is active and exercises frequently without limitation or symptoms.  She did have some symptoms of hypertension, atypical chest pain and slow heart rate postpartum and was evaluated in the emergency room several times.  A bedside echo was unrevealing.  Multiple imaging modalities for visual abnormalities were unrevealing.  Her symptoms of all resolved spontaneously.   Current Meds  Medication Sig  . albuterol (PROVENTIL) (2.5 MG/3ML) 0.083% nebulizer solution Inhale 2.5 mg into the lungs every 6 (six) hours as needed.   . mometasone (NASONEX) 50 MCG/ACT nasal spray 2 sprays by Each Nare route Two (2) times a day.  . Omega-3 Fatty Acids (FISH OIL) 1200 MG CAPS Take 2 capsules by mouth daily.  . Prenatal Vit-Fe Fumarate-FA (PRENATAL MULTIVITAMIN) TABS tablet Take 1 tablet by mouth daily at 12 noon.     Allergies  Allergen Reactions  . Penicillins Anaphylaxis and Swelling    Has patient had a PCN reaction causing immediate rash, facial/tongue/throat swelling, SOB or lightheadedness with hypotension: Yes Has patient had a PCN reaction causing severe rash involving mucus membranes or skin necrosis: No Has patient had a PCN reaction that required  hospitalization: No Has patient had a PCN reaction occurring within the last 10 years: No If all of the above answers are "NO", then may proceed with Cephalosporin use.   . Citalopram Other (See Comments) and Rash    RASH and sedation sedation RASH and sedation     Social History   Socioeconomic History  . Marital status: Married    Spouse name: Not on file  . Number of children: Not on file  . Years of education: Not on file  . Highest education level: Not on file  Occupational History  . Not on file  Social Needs  . Financial resource strain: Not hard at all  . Food insecurity:    Worry: Never true    Inability: Never true  . Transportation needs:    Medical: No    Non-medical: Not on file  Tobacco Use  . Smoking status: Former Smoker    Types: Cigarettes  . Smokeless tobacco: Never Used  Substance and Sexual Activity  . Alcohol use: Not Currently  . Drug use: Never  . Sexual activity: Yes  Lifestyle  . Physical activity:    Days per week: Not on file    Minutes per session: Not on file  . Stress: Rather much  Relationships  . Social connections:    Talks on phone: Not on file    Gets together: Not on file    Attends religious service: Not on file    Active member of club or organization: Not on file    Attends meetings of clubs or organizations: Not on  file    Relationship status: Not on file  . Intimate partner violence:    Fear of current or ex partner: No    Emotionally abused: No    Physically abused: No    Forced sexual activity: No  Other Topics Concern  . Not on file  Social History Narrative  . Not on file     Review of Systems: General: negative for chills, fever, night sweats or weight changes.  Cardiovascular: negative for chest pain, dyspnea on exertion, edema, orthopnea, palpitations, paroxysmal nocturnal dyspnea or shortness of breath Dermatological: negative for rash Respiratory: negative for cough or wheezing Urologic: negative for  hematuria Abdominal: negative for nausea, vomiting, diarrhea, bright red blood per rectum, melena, or hematemesis Neurologic: negative for visual changes, syncope, or dizziness All other systems reviewed and are otherwise negative except as noted above.    Blood pressure 104/60, pulse 63, height 5' 4.5" (1.638 m), weight 137 lb (62.1 kg), unknown if currently breastfeeding.  General appearance: alert and no distress Neck: no adenopathy, no carotid bruit, no JVD, supple, symmetrical, trachea midline and thyroid not enlarged, symmetric, no tenderness/mass/nodules Lungs: clear to auscultation bilaterally Heart: regular rate and rhythm, S1, S2 normal, no murmur, click, rub or gallop Extremities: extremities normal, atraumatic, no cyanosis or edema Pulses: 2+ and symmetric Skin: Skin color, texture, turgor normal. No rashes or lesions Neurologic: Alert and oriented X 3, normal strength and tone. Normal symmetric reflexes. Normal coordination and gait  EKG not performed today  ASSESSMENT AND PLAN:   Atypical chest pain Ms. Coven was referred by Dr. Huntley Dec for atypical chest pain.  This occurred postpartum.  She was seen in the ER twice.  The pain was somewhat pleuritic and left-sided.  It has resolved over the last several weeks spontaneously.  Slow heart rate History of bradycardia in the immediate postpartum time.  Which has since resolved  Postpartum hypertension Was initially treated with nifedipine which she has since discontinued because of hypotension.  She is normotensive today.      Runell Gess MD FACP,FACC,FAHA, [facility] 01/27/2019 11:03 AM

## 2019-01-27 NOTE — Patient Instructions (Signed)
Medication Instructions:  Your physician recommends that you continue on your current medications as directed. Please refer to the Current Medication list given to you today.  If you need a refill on your cardiac medications before your next appointment, please call your pharmacy.   Lab work: NONE If you have labs (blood work) drawn today and your tests are completely normal, you will receive your results only by: . MyChart Message (if you have MyChart) OR . A paper copy in the mail If you have any lab test that is abnormal or we need to change your treatment, we will call you to review the results.  Testing/Procedures: NONE  Follow-Up: At [facility], you and your health needs are our priority.  As part of our continuing mission to provide you with exceptional heart care, we have created designated Provider Care Teams.  These Care Teams include your primary Cardiologist (physician) and Advanced Practice Providers (APPs -  Physician Assistants and Nurse Practitioners) who all work together to provide you with the care you need, when you need it. . You may schedule a follow up appointment AS NEEDED. You may see Dr. Berry or one of the following Advanced Practice Providers on your designated Care Team:   . Luke Kilroy, PA-C . Hao Meng, PA-C . Angela Duke, PA-C . Kathryn Lawrence, DNP . Rhonda Barrett, PA-C . Krista Kroeger, PA-C . Callie Goodrich, PA-C    

## 2019-01-27 NOTE — Assessment & Plan Note (Signed)
History of bradycardia in the immediate postpartum time.  Which has since resolved

## 2019-01-27 NOTE — Assessment & Plan Note (Signed)
Kelsey Griffith was referred by Dr. Huntley Dec for atypical chest pain.  This occurred postpartum.  She was seen in the ER twice.  The pain was somewhat pleuritic and left-sided.  It has resolved over the last several weeks spontaneously.

## 2019-09-30 ENCOUNTER — Emergency Department (HOSPITAL_COMMUNITY)
Admission: EM | Admit: 2019-09-30 | Discharge: 2019-09-30 | Disposition: A | Discharge status: Home / Self Care | Payer: Managed Care, Other (non HMO) | Attending: Emergency Medicine | Admitting: Emergency Medicine

## 2019-09-30 ENCOUNTER — Emergency Department (HOSPITAL_COMMUNITY): Payer: Managed Care, Other (non HMO)

## 2019-09-30 DIAGNOSIS — R0602 Shortness of breath: Secondary | ICD-10-CM | POA: Insufficient documentation

## 2019-09-30 DIAGNOSIS — Z87891 Personal history of nicotine dependence: Secondary | ICD-10-CM | POA: Diagnosis not present

## 2019-09-30 DIAGNOSIS — Z79899 Other long term (current) drug therapy: Secondary | ICD-10-CM | POA: Diagnosis not present

## 2019-09-30 DIAGNOSIS — Z20828 Contact with and (suspected) exposure to other viral communicable diseases: Secondary | ICD-10-CM | POA: Diagnosis not present

## 2019-09-30 DIAGNOSIS — J45909 Unspecified asthma, uncomplicated: Secondary | ICD-10-CM | POA: Insufficient documentation

## 2019-09-30 LAB — CBC WITH DIFFERENTIAL/PLATELET
Abs Immature Granulocytes: 0.01 10*3/uL (ref 0.00–0.07)
Basophils Absolute: 0 10*3/uL (ref 0.0–0.1)
Basophils Relative: 0 %
Eosinophils Absolute: 0.1 10*3/uL (ref 0.0–0.5)
Eosinophils Relative: 3 %
HCT: 42.4 % (ref 36.0–46.0)
Hemoglobin: 13.9 g/dL (ref 12.0–15.0)
Immature Granulocytes: 0 %
Lymphocytes Relative: 36 %
Lymphs Abs: 1.7 10*3/uL (ref 0.7–4.0)
MCH: 30.5 pg (ref 26.0–34.0)
MCHC: 32.8 g/dL (ref 30.0–36.0)
MCV: 93.2 fL (ref 80.0–100.0)
Monocytes Absolute: 0.3 10*3/uL (ref 0.1–1.0)
Monocytes Relative: 7 %
Neutro Abs: 2.5 10*3/uL (ref 1.7–7.7)
Neutrophils Relative %: 54 %
Platelets: 227 10*3/uL (ref 150–400)
RBC: 4.55 MIL/uL (ref 3.87–5.11)
RDW: 12.8 % (ref 11.5–15.5)
WBC: 4.7 10*3/uL (ref 4.0–10.5)
nRBC: 0 % (ref 0.0–0.2)

## 2019-09-30 LAB — COMPREHENSIVE METABOLIC PANEL
ALT: 16 U/L (ref 0–44)
AST: 22 U/L (ref 15–41)
Albumin: 4.1 g/dL (ref 3.5–5.0)
Alkaline Phosphatase: 54 U/L (ref 38–126)
Anion gap: 9 (ref 5–15)
BUN: 9 mg/dL (ref 6–20)
CO2: 22 mmol/L (ref 22–32)
Calcium: 9.5 mg/dL (ref 8.9–10.3)
Chloride: 108 mmol/L (ref 98–111)
Creatinine, Ser: 0.8 mg/dL (ref 0.44–1.00)
GFR calc Af Amer: >60 mL/min (ref >60)
GFR calc non Af Amer: >60 mL/min (ref >60)
Glucose, Bld: 85 mg/dL (ref 70–99)
Potassium: 4.2 mmol/L (ref 3.5–5.1)
Sodium: 139 mmol/L (ref 135–145)
Total Bilirubin: 0.9 mg/dL (ref 0.3–1.2)
Total Protein: 6.5 g/dL (ref 6.5–8.1)

## 2019-09-30 LAB — D-DIMER, QUANTITATIVE: D-Dimer, Quant: 0.4 ug/mL-FEU (ref 0.00–0.50)

## 2019-09-30 LAB — SARS CORONAVIRUS 2 (TAT 6-24 HRS): SARS Coronavirus 2: NEGATIVE

## 2019-09-30 LAB — TROPONIN I (HIGH SENSITIVITY): Troponin I (High Sensitivity): <2 ng/L (ref <18)

## 2019-09-30 LAB — BRAIN NATRIURETIC PEPTIDE: B Natriuretic Peptide: 14 pg/mL (ref 0.0–100.0)

## 2019-09-30 NOTE — ED Provider Notes (Signed)
[facility] EMERGENCY DEPARTMENT Provider Note   CSN: [id number] Arrival date & time: 09/30/19  9563     History   Chief Complaint Chief Complaint  Patient presents with  . Shortness of Breath    HPI Kelsey Griffith is a 43 y.o. female.     HPI   Patient has a past medical history of asthma presenting today for concern that her heart rate was too low earlier today, in the 50s, noted on her apple watch.  Reportedly, she has had intermittent shortness of breath since January, that has been worked up for both cardiac and embolism etiology as symptoms were started after she had her child in January 2020.  Shortness of breath has not changed since symptom onset in January. She states that over the weekend, she has had rhinorrhea, congestion, dry cough.  She has had no other symptoms that have been new since the start of symptom onset in January.  She denies any fevers, chills, sweats, nausea, emesis.  Endorses palpitations that have been worked up previously by her cardiologist but never with a Holter or Zio patch.  Denies any presyncope, dizziness, lightheadedness.  She has not tried any medications for symptoms.  There seem to be no aggravating or alleviating factors.  Past Medical History:  Diagnosis Date  . Asthma   . 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 Atypical chest pain 01/27/2019  . Slow heart rate 01/27/2019  . Postpartum hypertension 01/27/2019  . Pregnant 12/08/2018    Past Surgical History:  Procedure Laterality Date  . BREAST BIOPSY    . SEPTOPLASTY  2003  . WISDOM TOOTH EXTRACTION       OB History    Gravida  4   Para  3   Term  3   Preterm      AB  1   Living  3     SAB      TAB  1   Ectopic      Multiple  0   Live Births  3            Home Medications    Prior to Admission medications   Medication Sig Start Date End Date Taking? Authorizing Provider   albuterol (PROVENTIL) (2.5 MG/3ML) 0.083% nebulizer solution Inhale 2.5 mg into the lungs every 6 (six) hours as needed.  01/04/14 02/11/19  [provider]  mometasone (NASONEX) 50 MCG/ACT nasal spray 2 sprays by Each Nare route Two (2) times a day. 02/07/15   [provider]  Omega-3 Fatty Acids (FISH OIL) 1200 MG CAPS Take 2 capsules by mouth daily.    [provider]  Prenatal Vit-Fe Fumarate-FA (PRENATAL MULTIVITAMIN) TABS tablet Take 1 tablet by mouth daily at 12 noon.    [provider]    Family History Family History  Problem Relation Age of Onset  . Rheum arthritis Mother   . Asthma Mother   . Arthritis/Rheumatoid Mother   . Heart disease Father   . Hypertension Father   . Bipolar disorder Brother   . Schizophrenia Brother   . Diabetes Brother   . Breast cancer Maternal Aunt   . Hypertension Paternal Grandfather   . Diabetes Paternal Grandfather   . Lung cancer Maternal Grandmother     Social History Social History   Tobacco Use  . Smoking status: Former Smoker    Types: Cigarettes  . Smokeless tobacco: Never  Used  Substance Use Topics  . Alcohol use: Not Currently  . Drug use: Never     Allergies   Penicillins and Citalopram   Review of Systems Review of Systems  Constitutional: Negative for chills and fever.  HENT: Positive for congestion, rhinorrhea and sore throat.   Respiratory: Positive for shortness of breath. Negative for cough.   Cardiovascular: Positive for palpitations. Negative for chest pain.  Gastrointestinal: Negative for abdominal pain, nausea and vomiting.  Skin: Negative for color change and rash.  Neurological: Negative for dizziness, syncope and light-headedness.  All other systems reviewed and are negative.    Physical Exam Updated Vital Signs BP 126/75 (BP Location: Right Arm)   Pulse 74   Temp 98.6 F (37 C) (Oral)   Resp 18   LMP 02/28/2019   SpO2 99%   Physical Exam Vitals signs and  nursing note reviewed.  Constitutional:      Appearance: She is well-developed. She is not toxic-appearing.  HENT:     Head: Normocephalic and atraumatic.  Eyes:     Conjunctiva/sclera: Conjunctivae normal.  Neck:     Musculoskeletal: Neck supple.  Cardiovascular:     Rate and Rhythm: Normal rate and regular rhythm.     Pulses: Normal pulses.     Heart sounds: No murmur.     Comments: Heart rate in the 60s to 80s.  Normal rhythm. Pulmonary:     Effort: Pulmonary effort is normal. No tachypnea, accessory muscle usage or respiratory distress.     Breath sounds: Normal breath sounds.  Abdominal:     Palpations: Abdomen is soft.     Tenderness: There is no abdominal tenderness.  Musculoskeletal:     Right lower leg: No edema.     Left lower leg: No edema.  Skin:    General: Skin is warm and dry.     Capillary Refill: Capillary refill takes less than 2 seconds.  Neurological:     General: No focal deficit present.     Mental Status: She is alert and oriented to person, place, and time.     Motor: No weakness.  Psychiatric:        Mood and Affect: Mood normal.        Behavior: Behavior normal.      ED Treatments / Results  Labs (all labs ordered are listed, but only abnormal results are displayed) Labs Reviewed  CBC WITH DIFFERENTIAL/PLATELET  D-DIMER, QUANTITATIVE (NOT AT [facility])  COMPREHENSIVE METABOLIC PANEL  BRAIN NATRIURETIC PEPTIDE  TROPONIN I (HIGH SENSITIVITY)    EKG None  Radiology Dg Chest 2 View  Result Date: 09/30/2019 CLINICAL DATA:  Shortness of breath EXAM: CHEST - 2 VIEW COMPARISON:  12/14/2018 FINDINGS: Heart and mediastinal contours are within normal limits. No focal opacities or effusions. No acute bony abnormality. IMPRESSION: No active cardiopulmonary disease. Electronically Signed   By: Charlett Nose M.D.   On: 09/30/2019 10:18    Procedures Procedures (including critical care time)  Medications Ordered in ED Medications - No data to display    Initial Impression / Assessment and Plan / ED Course  I have reviewed the triage vital signs and the nursing notes.  Pertinent labs & imaging results that were available during my care of the patient were reviewed by me and considered in my medical decision making (see chart for details).        Patient has a past medical history of asthma presenting today for shortness of breath that has been  unchanged since January 2020 and been worked up multiple times for cardiac and embolism etiology.  Patient arrives to our ED afebrile, hemodynamically stable, on room air.  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  Differential diagnosis includes cardiac arrhythmia, COVID-19, a viral URI.  Triage labs were reviewed, no signs of sepsis are seen.  D-dimer was negative.  Chest x-ray shows no acute cardiopulmonary process.  EKG reviewed and showed no bradycardia, concern for LVH, PR prolongation. Doubt PE, ACS, other emergent pathology at this time.  At this time, recommend outpatient follow-up with patient's cardiologist for either a Holter or Zio patch.  Outpatient Covid test ordered.  Covid precautions explained.  All questions answered.  Care patient discussed with the supervising attending.  Final Clinical Impressions(s) / ED Diagnoses   Final diagnoses:  None    ED Discharge Orders    None       Chester HolsteinVaithi, Adan Beal, MD 09/30/19 1712    Linwood DibblesKnapp, Jon, MD 10/01/19 [phone]

## 2019-09-30 NOTE — ED Triage Notes (Addendum)
Pt having variety of symptoms that are vague such as vision changes and shortness of breath. 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 She reports sometimes her SOB is so bad, she can hardly talk to pts.  Vitals stable. Hx of pulmonary mycobacterium. Has seen cards for same issue. No echo has been performed yet. Pt states she has been feeling irritable and having brain fog as well.

## 2019-10-04 ENCOUNTER — Encounter: Payer: Self-pay | Admitting: Cardiology

## 2019-10-04 ENCOUNTER — Telehealth (INDEPENDENT_AMBULATORY_CARE_PROVIDER_SITE_OTHER): Payer: Managed Care, Other (non HMO) | Admitting: Cardiology

## 2019-10-04 ENCOUNTER — Telehealth: Payer: Self-pay

## 2019-10-04 VITALS — BP 100/63 | HR 78 | Ht 64.5 in | Wt 133.0 lb

## 2019-10-04 DIAGNOSIS — R0602 Shortness of breath: Secondary | ICD-10-CM | POA: Diagnosis not present

## 2019-10-04 DIAGNOSIS — R0789 Other chest pain: Secondary | ICD-10-CM

## 2019-10-04 DIAGNOSIS — Z8619 Personal history of other infectious and parasitic diseases: Secondary | ICD-10-CM | POA: Insufficient documentation

## 2019-10-04 DIAGNOSIS — O165 Unspecified maternal hypertension, complicating the puerperium: Secondary | ICD-10-CM

## 2019-10-04 DIAGNOSIS — R0600 Dyspnea, unspecified: Secondary | ICD-10-CM | POA: Insufficient documentation

## 2019-10-04 DIAGNOSIS — R001 Bradycardia, unspecified: Secondary | ICD-10-CM

## 2019-10-04 DIAGNOSIS — R002 Palpitations: Secondary | ICD-10-CM

## 2019-10-04 DIAGNOSIS — R519 Headache, unspecified: Secondary | ICD-10-CM

## 2019-10-04 NOTE — Telephone Encounter (Signed)
Contacted patient to discuss AVS Instructions. Gave patient Kelsey Griffith's recommendations from today's virtual office visit. Informed patient that someone from the scheduling dept will be in contact with them to schedule her appts for Echo and 14 day Zio. Patient voiced understanding and AVS mailed.

## 2019-10-04 NOTE — Assessment & Plan Note (Signed)
Resolved, bedside echo reported as normal in Jan 2020 but no formal report

## 2019-10-04 NOTE — Telephone Encounter (Signed)
Attempted multiple times to reach patient but her phone goes straight to voicemail and her mailbox is full. Tried to reach patients spouse but he did not answer and his mailbox was full as well. Sent a message via mychart advising patient to contact office for appt or to reschedule. Informed Kelsey Griffith of patient status.

## 2019-10-04 NOTE — Patient Instructions (Addendum)
Medication Instructions:  Your physician recommends that you continue on your current medications as directed. Please refer to the Current Medication list given to you today. *If you need a refill on your cardiac medications before your next appointment, please call your pharmacy*  Lab Work: None  If you have labs (blood work) drawn today and your tests are completely normal, you will receive your results only by: Marland Kitchen MyChart Message (if you have MyChart) OR . A paper copy in the mail If you have any lab test that is abnormal or we need to change your treatment, we will call you to review the results.  Testing/Procedures: Your physician has requested that you have an echocardiogram. Echocardiography is a painless test that uses sound waves to create images of your heart. It provides your doctor with information about the size and shape of your heart and how well your heart's chambers and valves are working. This procedure takes approximately one hour. There are no restrictions for this procedure. [facility] has recommended that you wear a 14 DAY ZIO-PATCH monitor. The Zio patch cardiac monitor continuously records heart rhythm data for up to 14 days, this is for patients being evaluated for multiple types heart rhythms. For the first 24 hours post application, please avoid getting the Zio monitor wet in the shower or by excessive sweating during exercise. After that, feel free to carry on with regular activities. Keep soaps and lotions away from the ZIO XT Patch.  This will be mailed to you, please expect 7-10 days to receive.  Monitor will be placed at our [facility] location - [address], [address].       Follow-Up: At [facility], you and your health needs are our priority.  As part of our continuing mission to provide you with exceptional heart care, we have created designated Provider Care Teams.  These Care Teams include your primary Cardiologist  (physician) and Advanced Practice Providers (APPs -  Physician Assistants and Nurse Practitioners) who all work together to provide you with the care you need, when you need it.  Your next appointment:   TO BE DETERMINED AFTER TEST RESULTS   The format for your next appointment:   Either In Person or Virtual  Provider:   You may see Quay Burow, MD or one of the following Advanced Practice Providers on your designated Care Team:    Kerin Ransom, PA-C  [place], [place]  Coletta Memos, [place]   Other Instructions

## 2019-10-04 NOTE — Assessment & Plan Note (Signed)
Feeling of dyspnea which she says is different from her asthma, RAD dyspnea.  Symptoms are at rest and can be associated with palpitations.

## 2019-10-04 NOTE — Telephone Encounter (Signed)
Recieved mychart message from patient with updated number called patient and got virtual visit started patient was frustrated because we did not have her updated phone number in her chart. I informed her that I would update the number and apologized for the inconvenience; then got virtual visit started.

## 2019-10-04 NOTE — Assessment & Plan Note (Signed)
Atypical chest pain- high sensitivity Troponin negative

## 2019-10-04 NOTE — Assessment & Plan Note (Signed)
Possible complex migraine- consider neuro eval once cardiac work up done if her symptoms persist

## 2019-10-04 NOTE — Assessment & Plan Note (Addendum)
History of palpitations and bradycardia. Will check a monitor to r/o arrhyhtmia

## 2019-10-04 NOTE — Progress Notes (Signed)
Virtual Visit via Video Note   This visit type was conducted due to national recommendations for restrictions regarding the COVID-19 Pandemic (e.g. social distancing) in an effort to limit this patient's exposure and mitigate transmission in our community.  Due to her co-morbid illnesses, this patient is at least at moderate risk for complications without adequate follow up.  This format is felt to be most appropriate for this patient at this time.  All issues noted in this document were discussed and addressed.  A limited physical exam was performed with this format.  Please refer to the patient's chart for her consent to telehealth for [facility].   Date:  10/04/2019   ID:  Magnus Sinning, DOB July 22, 1976, MRN [id number]  Patient Location: Home Provider Location: Home  PCP:  Jodean Lima, MD  Cardiologist:  Dr Gwenlyn Found Electrophysiologist:  None   Evaluation Performed:  Follow-Up Visit  Chief Complaint:  SOB, palpitations  History of Present Illness:    Kelsey Griffith is a 43 y.o. female with a history of MAC diagnosed in 2017.  She is followed by Dr. Corrin Parker at [facility].  She was treated for this and has done well.  He does mention she has a history of RAD, she is on inhalers.    The patient delivered her third child in December 2019.  In 12/12/2018 she developed shortness of breath and atypical chest pain associated with diaphoresis.  She was seen in the emergency room.  Bedside echocardiogram reportedly showed preserved LV function.  No other significant abnormalities found and she was discharged. The patient also notes that her heart rate has been slow, sometimes in the 40s usually in the 50s.  She was discharged from that visit and then presented 2 days later with transient visual disturbance in her left eye.  MRI done 12/14/2018 showed small chronic white matter changes possibly secondary to microvascular ischemia or chronic migraine complex.  There was no infarct.  The patient denies  any history of migraines but admits she has had some headache issues since her delivery.  The patient was referred to Dr. Gwenlyn Found in February 2020 for atypical chest pain.  He felt her symptoms were noncardiac.  She had a history of bradycardia and hypertension in the immediate postpartum period but she was not bradycardic or hypertensive at that visit.  No further cardiac work-up was recommended.  She was seen again in the emergency room 09/30/2019 not feeling well and she had noted her heart rate was in the low 50s on her apple watch. EKG, high sensitivity Troponin, and D-dimer were all WNL.   She says she has remained short of breath since January.  When I interviewed her today it sounds like her symptoms are usually at rest.  This past weekend she went out for a walk and easy jog and felt pretty good.  She describes palpitations as well.  She says her heart "flips" in her chest.  She says when she lays down she has reflux and has not been sleeping well.  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  She has had some issues with depression in the past but could not tolerate SSRIs.  She does not feel like she is depressed now.  The patient does not have symptoms concerning for COVID-19 infection (fever, chills, cough, or new shortness of breath).    Past Medical History:  Diagnosis Date   Asthma    Pulmonary  mycobacteria ([place]) 2018   Past Surgical History:  Procedure Laterality Date   BREAST BIOPSY     SEPTOPLASTY  2003   WISDOM TOOTH EXTRACTION       Current Meds  Medication Sig   albuterol (PROVENTIL) (2.5 MG/3ML) 0.083% nebulizer solution Inhale 2.5 mg into the lungs every 6 (six) hours as needed.    Fluticasone-Salmeterol (ADVAIR) 100-50 MCG/DOSE AEPB Inhale into the lungs.   Omega-3 Fatty Acids (FISH OIL) 1200 MG CAPS Take 2 capsules by mouth daily.   Prenatal Vit-Fe Fumarate-FA (PRENATAL MULTIVITAMIN) TABS tablet Take  1 tablet by mouth daily at 12 noon.     Allergies:   Penicillins and Citalopram   Social History   Tobacco Use   Smoking status: Former Smoker    Types: Cigarettes   Smokeless tobacco: Never Used  Substance Use Topics   Alcohol use: Not Currently   Drug use: Never     Family Hx: The patient's family history includes Arthritis/Rheumatoid in her mother; Asthma in her mother; Bipolar disorder in her brother; Breast cancer in her maternal aunt; Diabetes in her brother and paternal grandfather; Heart disease in her father; Hypertension in her father and paternal grandfather; Lung cancer in her maternal grandmother; Rheum arthritis in her mother; Schizophrenia in her brother.  ROS:   Please see the history of present illness.    All other systems reviewed and are negative.   Prior CV studies:   The following studies were reviewed today:  EKG 09/30/2019  Labs/Other Tests and Data Reviewed:    EKG:  An ECG dated 09/30/2019 was personally reviewed today and demonstrated:  NSR- no acute changes HR 77  Recent Labs: 09/30/2019: ALT 16; B Natriuretic Peptide 14.0; BUN 9; Creatinine, Ser 0.80; Hemoglobin 13.9; Platelets 227; Potassium 4.2; Sodium 139   Recent Lipid Panel No results found for: CHOL, TRIG, HDL, CHOLHDL, LDLCALC, LDLDIRECT  Wt Readings from Last 3 Encounters:  10/04/19 133 lb (60.3 kg)  01/27/19 137 lb (62.1 kg)  12/08/18 160 lb (72.6 kg)     Objective:    Vital Signs:  BP 100/63    Pulse 78    Ht 5' 4.5" (1.638 m)    Wt 133 lb (60.3 kg)    BMI 22.48 kg/m    VITAL SIGNS:  reviewed  ASSESSMENT & PLAN:    Atypical chest pain Atypical chest pain- high sensitivity Troponin negative  Dyspnea Feeling of dyspnea which she says is different from her asthma, RAD dyspnea.  Symptoms are at rest and can be associated with palpitations  Palpitations and bradycardia History of palpitations and bradycardia. Will check a monitor to r/o arrhyhtmia   Post partum  hypertension Resolved, bedside echo reported as normal in Jan 2020 but no formal report  New onset headaches Possible complex migraine- consider neuro eval once cardiac work up done if her symptoms persist   History of MAC 2017- treated and followed at [facility]- Dr Corrin Parker.  She also has RAD- on inhalers.   Plan: check formal echo and 14 day ZIO. F/U pending these results.   COVID-19 Education: The signs and symptoms of COVID-19 were discussed with the patient and how to seek care for testing (follow up with PCP or arrange E-visit).  The importance of social distancing was discussed today.  Time:   Today, I have spent 20 minutes with the patient with telehealth technology discussing the above problems.     Medication Adjustments/Labs and Tests Ordered: Current medicines are reviewed at length  with the patient today.  Concerns regarding medicines are outlined above.   Tests Ordered: Orders Placed This Encounter  Procedures   LONG TERM MONITOR (3-14 DAYS)   ECHOCARDIOGRAM COMPLETE    Medication Changes: No orders of the defined types were placed in this encounter.   Follow Up:  Either In Person or Virtual F/U pending test results  Signed, Kerin Ransom, PA-C  10/04/2019 8:58 AM    [place]

## 2019-10-06 ENCOUNTER — Other Ambulatory Visit: Payer: Self-pay

## 2019-10-06 ENCOUNTER — Ambulatory Visit (HOSPITAL_COMMUNITY): Payer: Managed Care, Other (non HMO) | Attending: Cardiovascular Disease

## 2019-10-06 ENCOUNTER — Telehealth: Payer: Self-pay | Admitting: *Deleted

## 2019-10-06 DIAGNOSIS — R002 Palpitations: Secondary | ICD-10-CM | POA: Diagnosis not present

## 2019-10-06 DIAGNOSIS — R0602 Shortness of breath: Secondary | ICD-10-CM

## 2019-10-06 NOTE — Telephone Encounter (Signed)
14 day ZIO XT long term holter monitor to be mailed to the patients home.  Instructions reviewed briefly as they are included in the monitor kit. 

## 2019-10-12 ENCOUNTER — Telehealth: Payer: Self-pay | Admitting: Cardiovascular Disease

## 2019-10-12 ENCOUNTER — Telehealth: Payer: Self-pay | Admitting: Cardiology

## 2019-10-12 NOTE — Telephone Encounter (Signed)
Pt called saying she is continuing to have episodes of transient "spells" where she has a palpitation followed by "going blank": No sustained tachycardia, no syncope.  She is frustrated secondary to no diagnosis as yet.  She was concerned she may have COVID myocarditis- her echo was normal.  I suggested we go through with the monitor and I will arrange for her to f/u with a cardiologist.  Kerin Ransom PA-C 10/12/2019 3:39 PM

## 2019-10-12 NOTE — Telephone Encounter (Signed)
Reviewed chart and current orders/testing with pt. Attempted to discuss pt symptoms and concerns. 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 Informed her that would route message to see if he can reach out to her. She verbalized understanding

## 2019-10-12 NOTE — Telephone Encounter (Signed)
Patient c/o Palpitations:  High priority if patient c/o lightheadedness, shortness of breath, or chest pain  1) How long have you had palpitations/irregular HR/ Afib? Are you having the symptoms now? 7-10 days off and on, she is not having symptoms now.  2) Are you currently experiencing lightheadedness, SOB or CP? Chest pain throughout the day and occasional SOB  3) Do you have a history of afib (atrial fibrillation) or irregular heart rhythm? no  4) Have you checked your BP or HR? (document readings if available): no but stated that her HR ranges from mid 50's-140's  5) Are you experiencing any other symptoms? Losing train of thought  Patients PCP thinks she may have had Covid back in January. Wondering if this is a long term effect of Covid or not and what she can do to stop it.

## 2019-10-28 ENCOUNTER — Other Ambulatory Visit: Payer: Self-pay

## 2019-10-28 DIAGNOSIS — Z20822 Contact with and (suspected) exposure to covid-19: Secondary | ICD-10-CM

## 2019-10-30 LAB — NOVEL CORONAVIRUS, NAA: SARS-CoV-2, NAA: NOT DETECTED

## 2019-12-02 IMAGING — CR DG CHEST 2V
2 series · 2 of 2 positions shown · non-contrast
Comparison: None.

CLINICAL DATA: Chest pain since 4 o'clock this morning.
Bradycardia. Upper abdominal tenderness.

EXAM:
CHEST - 2 VIEW

[chest pa]
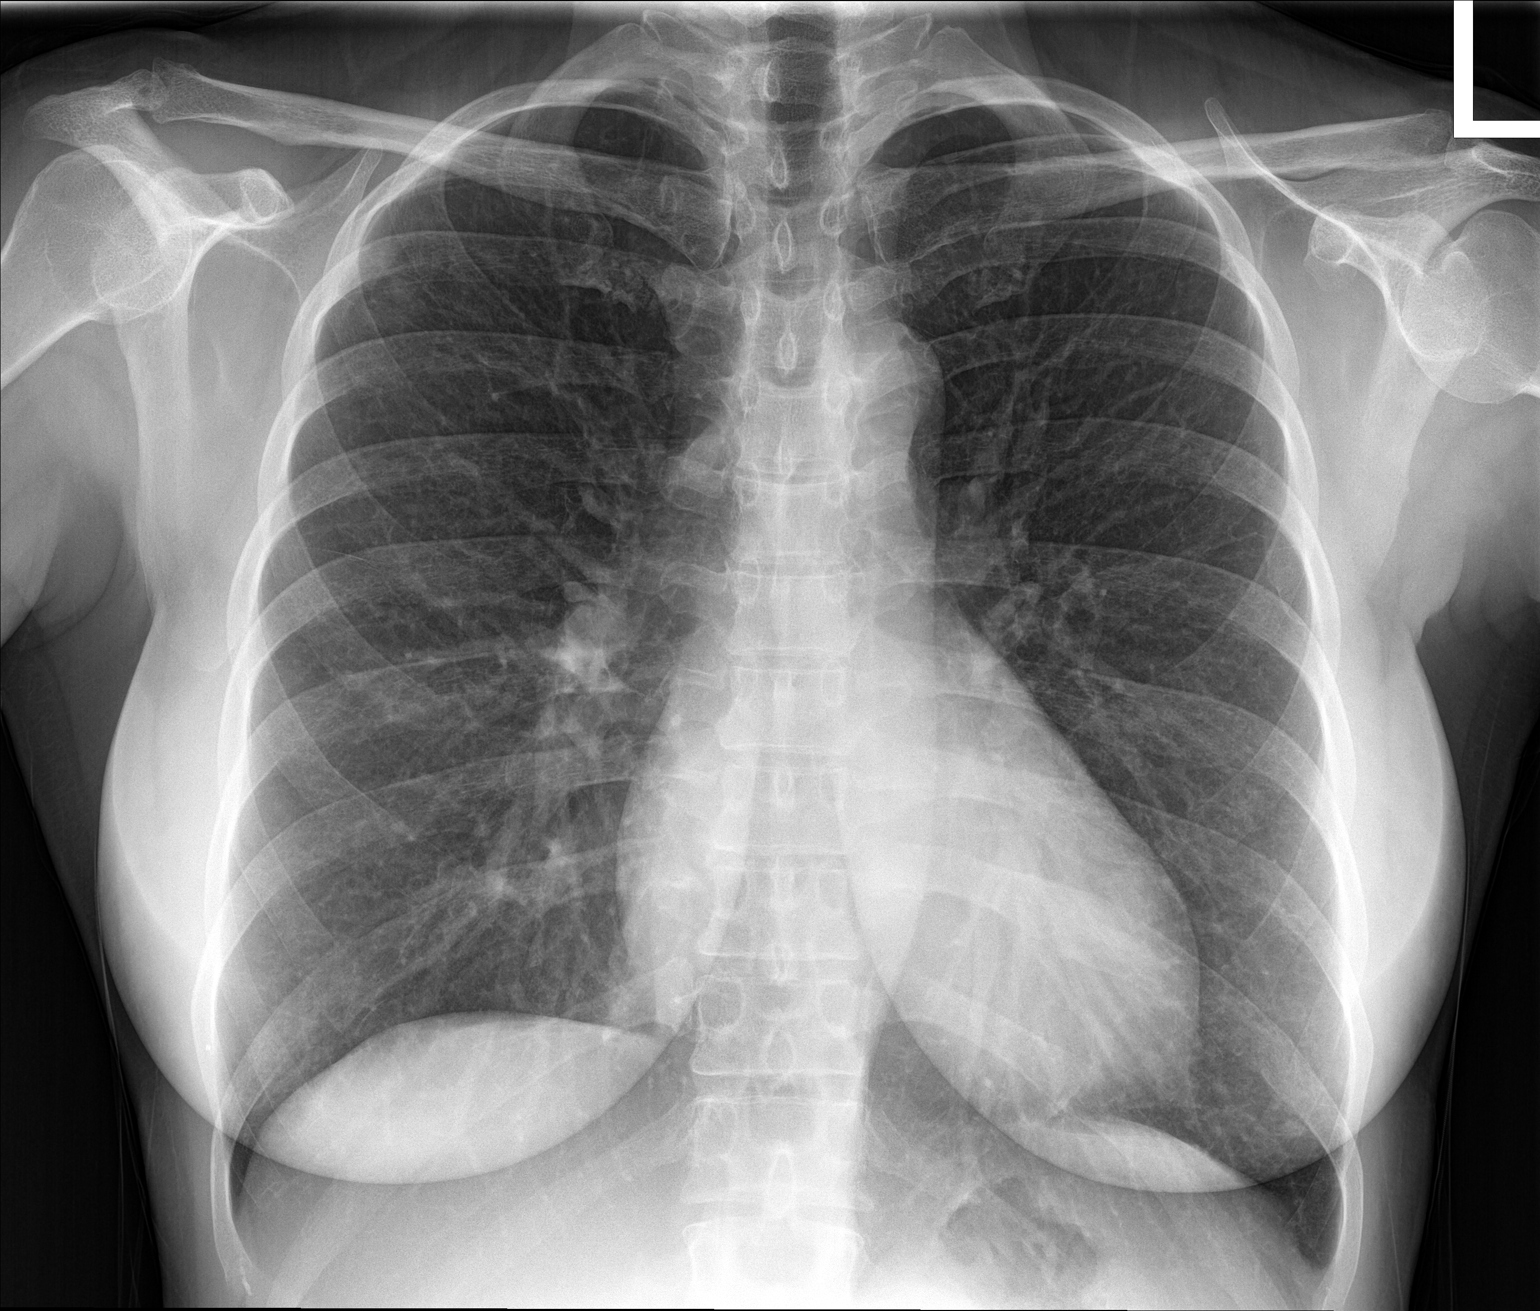

[chest lat]
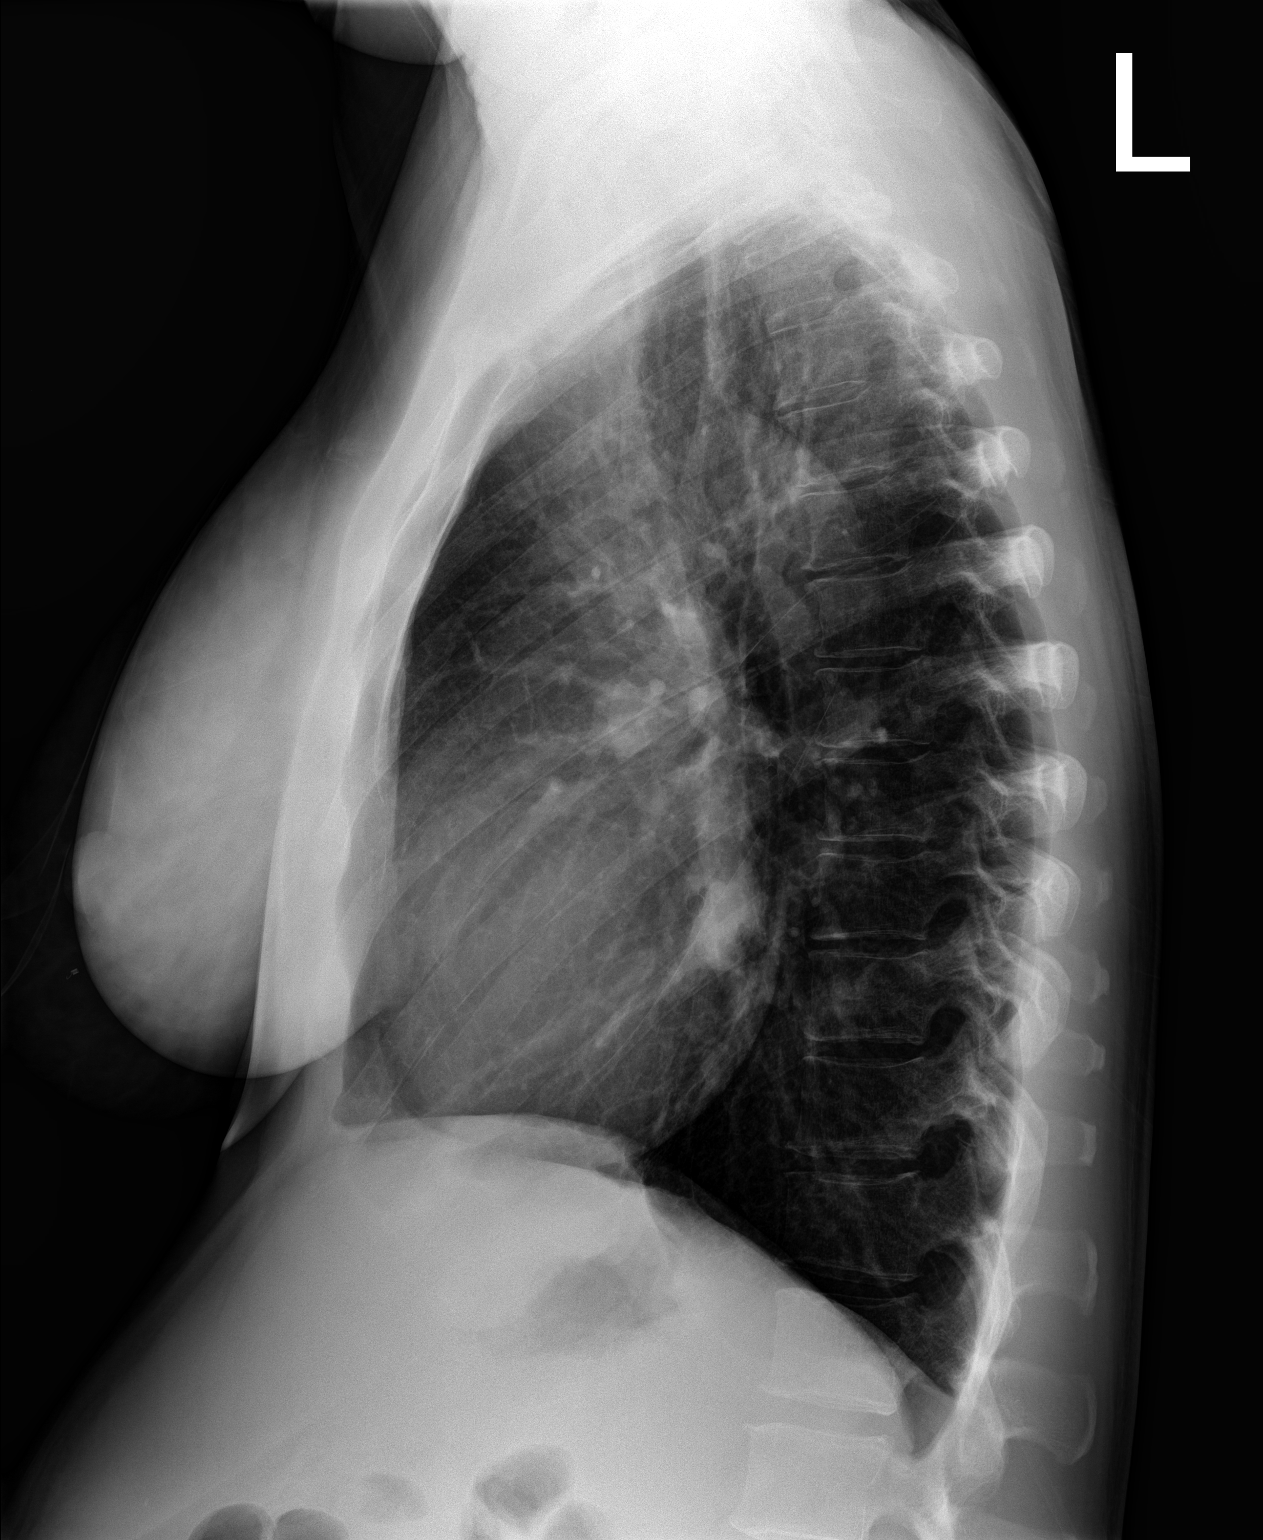

[2 of 2 positions shown; findings below may reference images not displayed]

FINDINGS: Normal heart, mediastinum and hila

Clear lungs.  No pleural effusion or pneumothorax.

Skeletal structures are unremarkable.
IMPRESSION: Normal chest radiographs.

## 2019-12-04 IMAGING — MR MR MRV HEAD W/O CM
9 of 11 series · 33 of 48 positions shown · non-contrast
Comparison: CT head 12/14/2018

CLINICAL DATA: Acute vision loss.  Six days postpartum.

EXAM:
MRI HEAD WITHOUT CONTRAST
MRV HEAD WITHOUT CONTRAST
TECHNIQUE: Multiplanar, multiecho pulse sequences of the brain and surrounding
structures were obtained without intravenous contrast. Angiographic
images of the intracranial venous structures were obtained using MRV
technique without intravenous contrast.

[Series 3: DWI · axial · 3.0mm · 0.94mm/px · z∈[-142,+19]mm · 8 of 116 slices shown (1 of 2)]
[im 1/116]
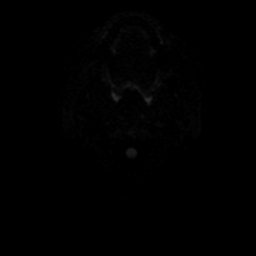
[im 17/116]
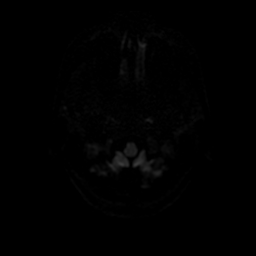
[im 33/116]
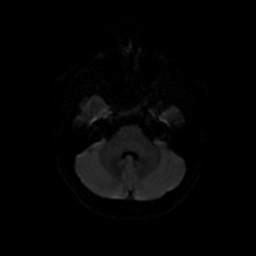
[im 50/116]
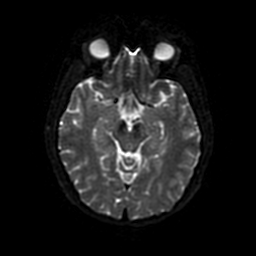
[im 66/116]
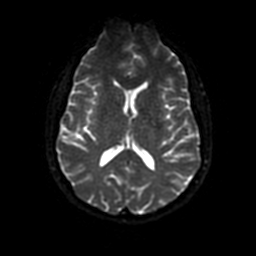
[im 83/116]
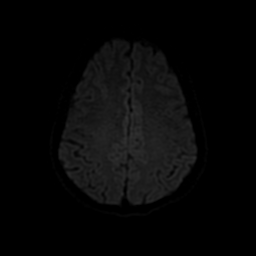
[im 99/116]
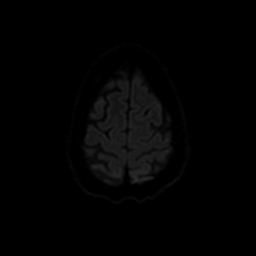
[im 116/116]
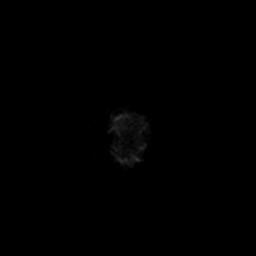

[Series 4: DWI · coronal · 4.0mm · 0.94mm/px · 5 of 78 slices shown (2 of 2)]
[im 1/78]
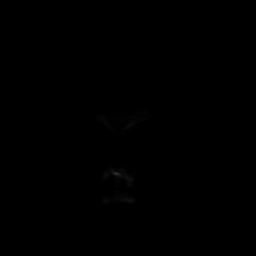
[im 20/78]
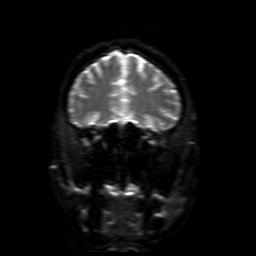
[im 39/78]
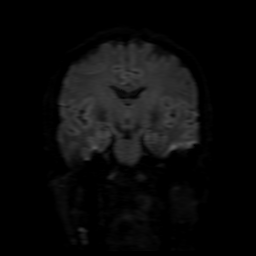
[im 58/78]
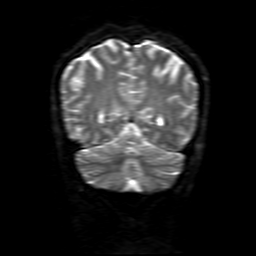
[im 78/78]
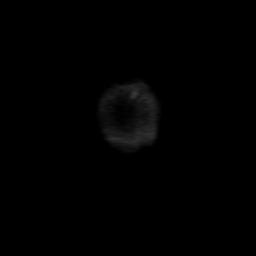

[Series 5: MRV · coronal · 1.5mm · 0.43mm/px · 5 of 139 slices shown]
[im 1/139]
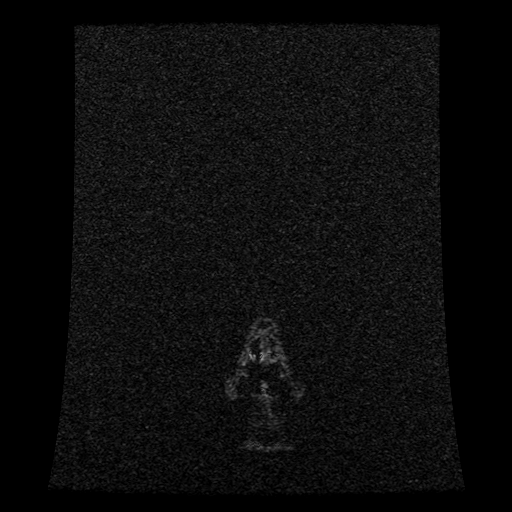
[im 16/139]
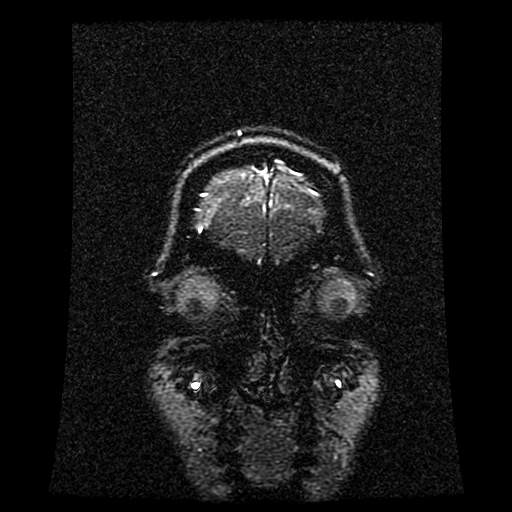
[im 47/139]
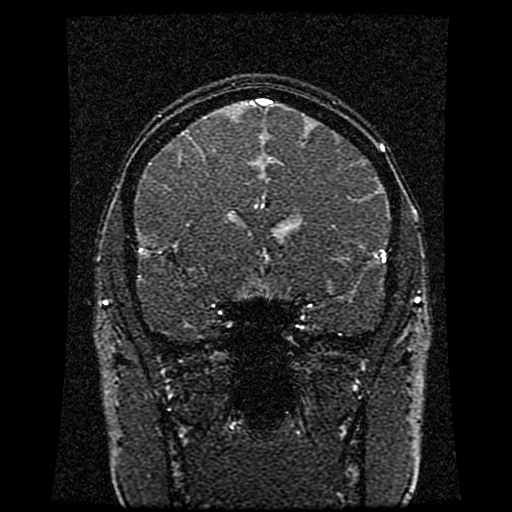
[im 62/139]
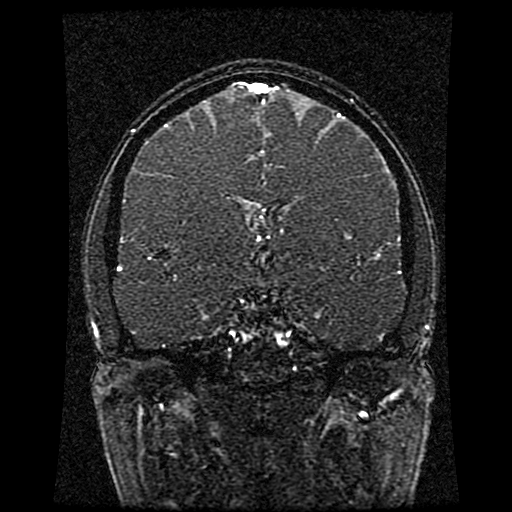
[im 77/139]
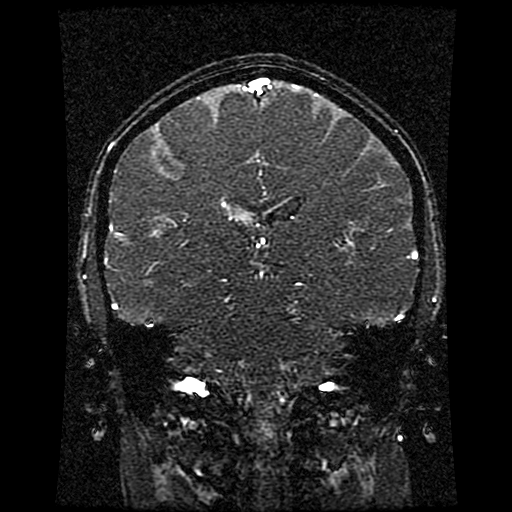

[Series 6: FLAIR · sagittal · 5.0mm · 0.47mm/px · 2 of 23 slices shown (1 of 2)]
[im 1/23]
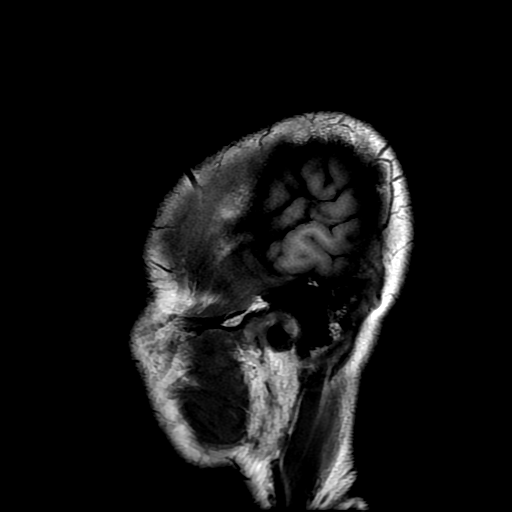
[im 23/23]
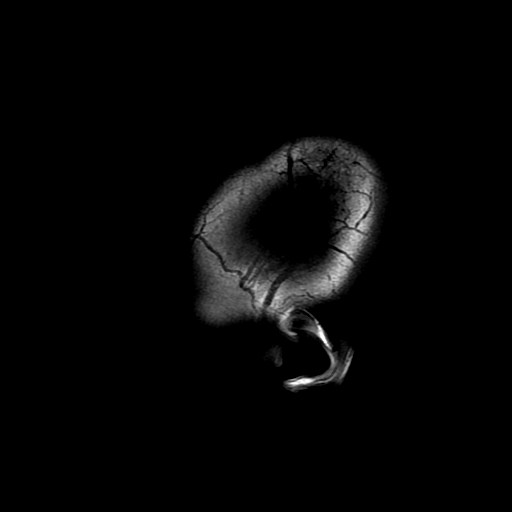

[Series 8: T2 · axial · 5.0mm · 0.47mm/px · z∈[-143,+18]mm · 2 of 29 slices shown (1 of 2)]
[im 1/29]
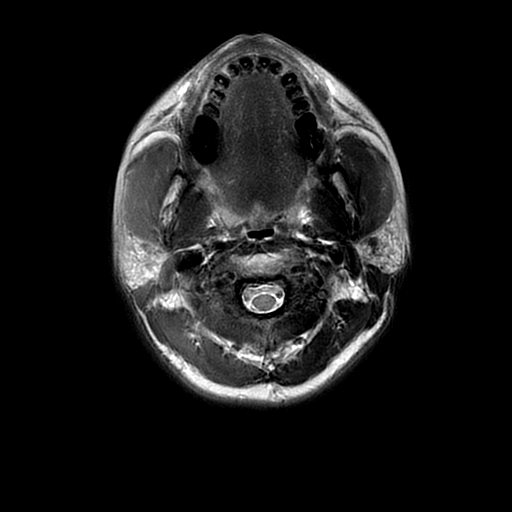
[im 29/29]
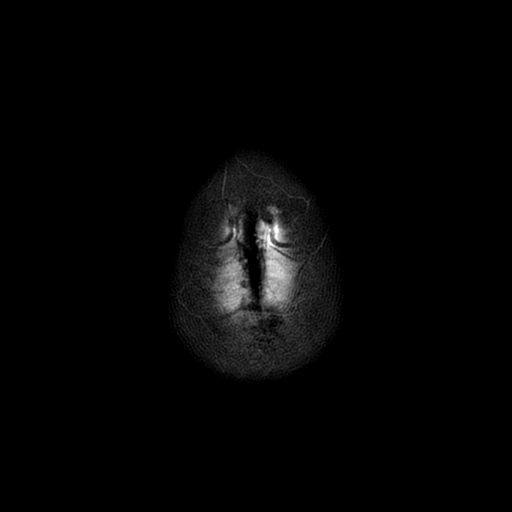

[Series 9: FLAIR · axial · 3.0mm · 0.45mm/px · z∈[-137,+11]mm · 2 of 27 slices shown (2 of 2)]
[im 1/27]
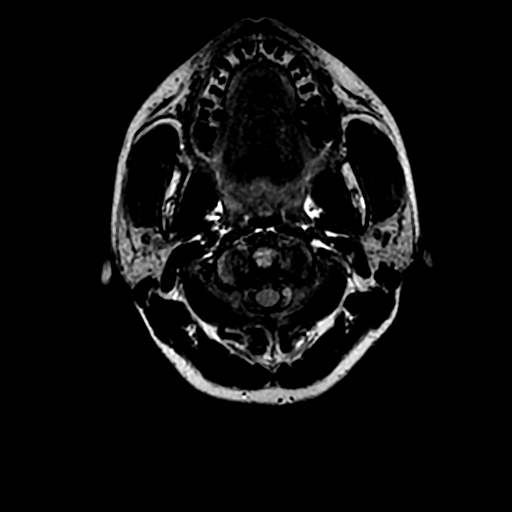
[im 27/27]
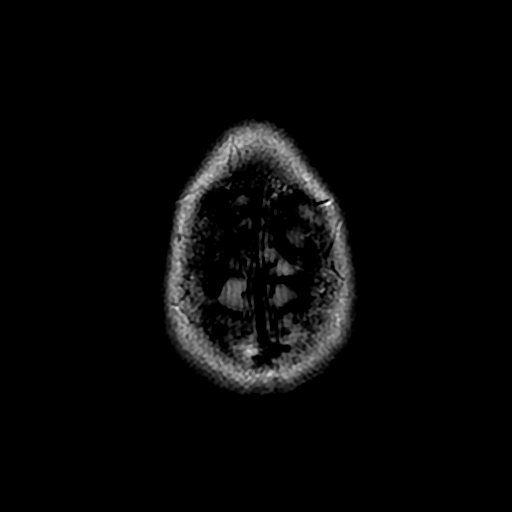

[Series 12: T2 · coronal · 5.0mm · 0.39mm/px · 2 of 27 slices shown (2 of 2)]
[im 1/27]
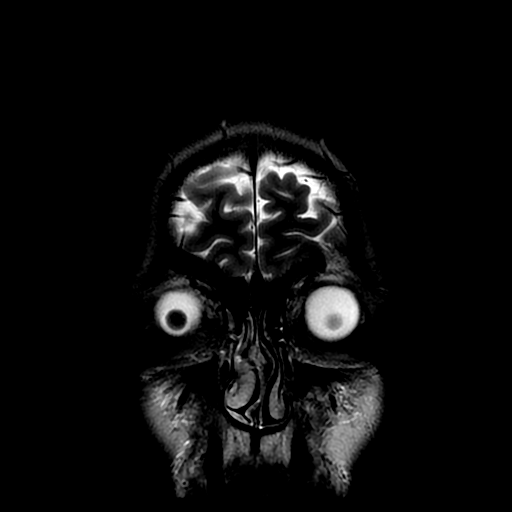
[im 27/27]
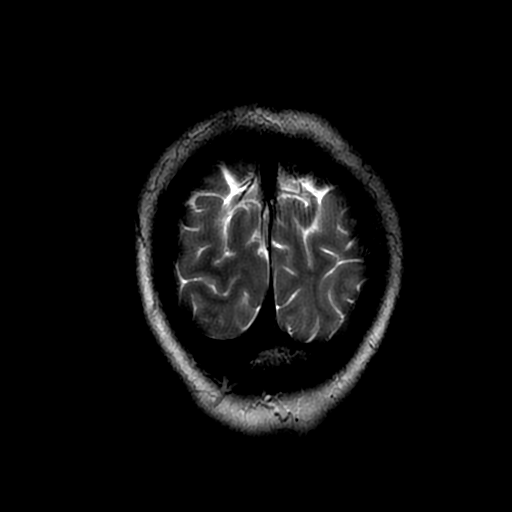

[Series 350: ADC · axial · 3.0mm · 0.94mm/px · z∈[-142,+19]mm · 4 of 58 slices shown (1 of 2)]
[im 1/58]
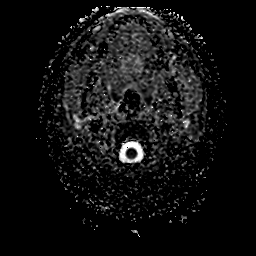
[im 20/58]
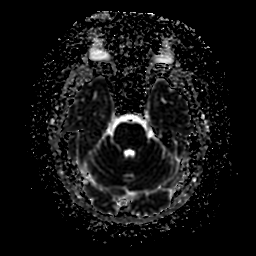
[im 39/58]
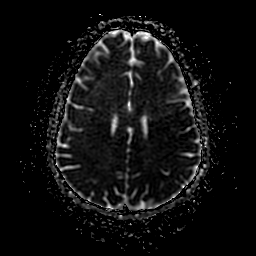
[im 58/58]
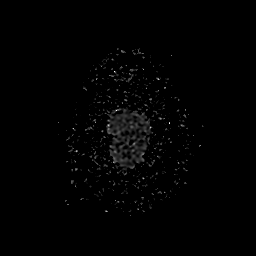

[Series 450: ADC · coronal · 4.0mm · 0.94mm/px · 3 of 39 slices shown (2 of 2)]
[im 1/39]
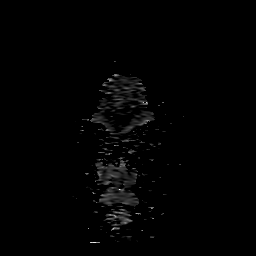
[im 20/39]
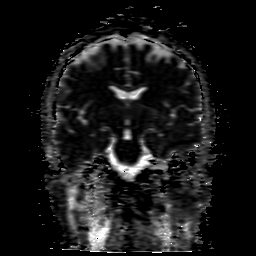
[im 39/39]
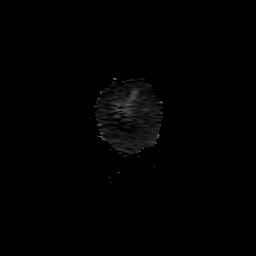

[33 of 48 positions shown; findings below may reference images not displayed]

FINDINGS: MR head:

Ventricle size and cerebral volume normal. Negative for acute
infarct. Scattered tiny subcortical white matter hyperintensities
bilaterally. Brainstem and cerebellum normal. Negative for
hemorrhage or mass.

Normal arterial flow voids.  Normal skull.

Paranasal sinuses clear.  Normal orbit.

MRV head:

Superior sagittal sinus appears normal. Right transverse sinus is
widely patent and dominant. Right sigmoid sinus and jugular vein are
dominant and widely patent. Left transverse sinus is hypoplastic.
Left sigmoid sinus and jugular vein are patent but small. No
evidence of acute venous thrombosis. Small left transverse sinus
appears congenital without secondary signs of thrombosis. No edema
or infarct is present. No hyperdensity in the left transverse sinus
on CT.
IMPRESSION: Negative for acute infarct.  No acute intracranial abnormality

Small chronic white matter changes which may be due to chronic
microvascular ischemia or chronic complex migraine headaches.

Hypoplastic left transverse sinus appears chronic and likely
congenital. No evidence of acute venous thrombosis.

## 2019-12-21 NOTE — Progress Notes (Addendum)
[facility]    Provider:  Dr Jaynee Eagles Requesting Provider: Cresenciano Lick MD Primary Care Provider:  Jodean Lima, MD  CC:  Headache with visual aura  HPI:  Kelsey Griffith is a 44 y.o. female here as requested by Jodean Lima, MD for headache. She has a PMHx of pulmonary Mycobacterium, postpartum hypertension, new onset of headache, slow heart rate.  I reviewed Dr. Maxine Glenn its notes, she was last seen November 10, it is been difficult for her due to her mycobacterial infection, she has also been having difficulty which sounds like an usual visual auras as well as palpitations, she is currently wearing an event device after seeing a cardiologist, when during one of her episodes she was seen in the local emergency room where she was noted to have white vessel disease with a differential including possible atypical migraine.  She has had severe fatigue, dyspnea.  I reviewed physical exam which was normal including head, ears, neck, throat, lungs, heart, abdomen, extremities, skin, limited neurologic exam.  I reviewed chest x-ray which showed no evidence of progressive infiltrates.  She was sent over for evaluation of atypical migraine especially given the findings on her MRI.  There is also significant stress and this may be playing a role to some degree.  She had her baby new years eve 2019, never had them before, this was her third child, she started with coldness, sweats one night, she slept all night, heart rate went down to 40s for a few weeks, she went to the hospital and had MRI brain and MRV, echo, worked her up. 2 days later she started having rectangle in the right eye like frosted glass, it was one eye, clear as day. She went back to the ER, pulse was 30s, stroke workup was negative, rectangle went away in 20 minutes. She feels fatigued. Happens with low pulse, sometimes not, she sees a spiral, red and blue, in both eyes, lasted for a few hours, then another day it  happened again, sometimes followed by a head and sometimes not, mostly not. There is an immense amount of stress. She has had insomnia. The headache on the left pulses, she has the pulsing almost every day, she has stopped botox. No Fhx of migraines.   Reviewed notes, labs and imaging from outside physicians, which showed:  Personally reviewed MRI of the brain reports (no images avail look for them tomorrow)  and MRV report:  Negative for acute infarct.  No acute intracranial abnormality  Small chronic white matter changes which may be due to chronic microvascular ischemia or chronic complex migraine headaches. 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 No evidence of acute venous thrombosis.  Ct showed No acute intracranial abnormalities including mass lesion or mass effect, hydrocephalus, extra-axial fluid collection, midline shift, hemorrhage, or acute infarction, large ischemic events (personally reviewed images)    Review of Systems: Patient complains of symptoms per HPI as well as the following symptoms: insomnia. stress. Pertinent negatives and positives per HPI. All others negative.   Social History   Socioeconomic History  . Marital status: Married    Spouse name: Not on file  . Number of children: 3  . Years of education: Not on file  . Highest education level: Master's degree (e.g., MA, MS, MEng, MEd, MSW, MBA)  Occupational History  . Not on file  Tobacco Use  . Smoking status: Former Smoker    Types: Cigarettes  . Smokeless tobacco: Never Used  Substance  and Sexual Activity  . Alcohol use: Not Currently  . Drug use: Not Currently    Comment: smoked pot when younger, about 30 years ago  . Sexual activity: Yes  Other Topics Concern  . Not on file  Social History Narrative   Lives at home husband & children   Right handed   Caffeine: drinks decaf coffee 16 oz    Social Determinants of Health   Financial Resource Strain:   .  Difficulty of Paying Living Expenses: Not on file  Food Insecurity:   . Worried About Charity fundraiser in the Last Year: Not on file  . Ran Out of Food in the Last Year: Not on file  Transportation Needs:   . Lack of Transportation (Medical): Not on file  . Lack of Transportation (Non-Medical): Not on file  Physical Activity:   . Days of Exercise per Week: Not on file  . Minutes of Exercise per Session: Not on file  Stress:   . Feeling of Stress : Not on file  Social Connections:   . Frequency of Communication with Friends and Family: Not on file  . Frequency of Social Gatherings with Friends and Family: Not on file  . Attends Religious Services: Not on file  . Active Member of Clubs or Organizations: Not on file  . Attends Archivist Meetings: Not on file  . Marital Status: Not on file  Intimate Partner Violence:   . Fear of Current or Ex-Partner: Not on file  . Emotionally Abused: Not on file  . Physically Abused: Not on file  . Sexually Abused: Not on file    Family History  Problem Relation Age of Onset  . Rheum arthritis Mother   . Asthma Mother   . Arthritis/Rheumatoid Mother   . Heart disease Father   . Hypertension Father   . Bipolar disorder Brother   . Schizophrenia Brother   . Diabetes Brother   . Breast cancer Maternal Aunt   . Hypertension Paternal Grandfather   . Diabetes Paternal Grandfather   . Lung cancer Maternal Grandmother   . Migraines Neg Hx   . Headache Neg Hx     Past Medical History:  Diagnosis Date  . Asthma   . Pulmonary mycobacteria ([place]) 2018    Patient Active Problem List   Diagnosis Date Noted  . Dyspnea 10/04/2019  . New onset of headaches 10/04/2019  . 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 Atypical chest pain 01/27/2019  . Slow heart rate 01/27/2019  . Postpartum hypertension 01/27/2019  . Pregnant 12/08/2018    Past Surgical History:  Procedure Laterality Date  . BREAST BIOPSY    . SEPTOPLASTY  2003  .  WISDOM TOOTH EXTRACTION      Current Outpatient Medications  Medication Sig Dispense Refill  . buPROPion (WELLBUTRIN) 75 MG tablet Take 75 mg by mouth 2 (two) times daily.    . Fluticasone-Salmeterol (ADVAIR) 100-50 MCG/DOSE AEPB Inhale into the lungs.    . Omega-3 Fatty Acids (FISH OIL) 1200 MG CAPS Take 2 capsules by mouth daily.    . Prenatal Vit-Fe Fumarate-FA (PRENATAL MULTIVITAMIN) TABS tablet Take 1 tablet by mouth daily at 12 noon.    Marland Kitchen albuterol (PROVENTIL) (2.5 MG/3ML) 0.083% nebulizer solution Inhale 2.5 mg into the lungs every 6 (six) hours as needed.     . cyclobenzaprine (FLEXERIL) 10 MG tablet Take 1 tablet (10 mg total) by mouth at bedtime. 90 tablet 3   No current facility-administered  medications for this visit.    Allergies as of 12/22/2019 - Review Complete 12/22/2019  Allergen Reaction Noted  . Penicillins Anaphylaxis and Swelling 09/24/2018  . Citalopram Other (See Comments) and Rash 05/05/2013    Vitals: BP 96/64 (BP Location: Right Arm, Patient Position: Sitting)   Pulse 70   Temp 97.6 F (36.4 C) Comment: taken at front  Ht _0  (1.651 m)   Wt 136 lb (61.7 kg)   Breastfeeding Yes   BMI 22.63 kg/m  Last Weight:  Wt Readings from Last 1 Encounters:  12/22/19 136 lb (61.7 kg)   Last Height:   Ht Readings from Last 1 Encounters:  12/22/19 _1  (1.651 m)     Physical exam: Exam: Gen: NAD, conversant, well nourised,  well groomed                     CV: RRR, no MRG. No Carotid Bruits. No peripheral edema, warm, nontender Eyes: Conjunctivae clear without exudates or hemorrhage  Neuro: Detailed Neurologic Exam  Speech:    Speech is normal; fluent and spontaneous with normal comprehension.  Cognition:    The patient is oriented to person, place, and time;     recent and remote memory intact;     language fluent;     normal attention, concentration,     fund of knowledge Cranial Nerves:    The pupils are equal, round, and reactive to light.  The fundi are normal and spontaneous venous pulsations are present. Visual fields are full to finger confrontation. Extraocular movements are intact. Trigeminal sensation is intact and the muscles of mastication are normal. The face is symmetric. The palate elevates in the midline. Hearing intact. Voice is normal. Shoulder shrug is normal. The tongue has normal motion without fasciculations.   Coordination:    Normal finger to nose and heel to shin. Normal rapid alternating movements.   Gait:    Heel-toe and tandem gait are normal.   Motor Observation:    No asymmetry, no atrophy, and no involuntary movements noted. Tone:    Normal muscle tone.    Posture:    Posture is normal. normal erect    Strength:    Strength is V/V in the upper and lower limbs.      Sensation: intact to LT     Reflex Exam:  DTR's:    Deep tendon reflexes in the upper and lower extremities are normal bilaterally.   Toes:    The toes are downgoing bilaterally.   Clonus:    Clonus is absent.    Assessment/Plan:  44 year old with migraine with aura, also fatigue   - Try magnesium it has good evidence in migraine aura (provided literature) - Recommend restarting botox, may help with headache (provided lactmed literature) - Will check several labs - exam is normal which is reassuring - labs to see if this is post covid infection, check tsh - flexeril at bedtime for myalgia  Orders Placed This Encounter  Procedures  . SAR CoV2 Serology (COVID 19)AB(IGG)IA  . TSH   Discussed: To prevent or relieve headaches, try the following: Cool Compress. Lie down and place a cool compress on your head.  Avoid headache triggers. If certain foods or odors seem to have triggered your migraines in the past, avoid them. A headache diary might help you identify triggers.  Include physical activity in your daily routine. Try a daily walk or other moderate aerobic exercise.  Manage stress. Find healthy  ways to cope with  the stressors, such as delegating tasks on your to-do list.  Practice relaxation techniques. Try deep breathing, yoga, massage and visualization.  Eat regularly. Eating regularly scheduled meals and maintaining a healthy diet might help prevent headaches. Also, drink plenty of fluids.  Follow a regular sleep schedule. Sleep deprivation might contribute to headaches Consider biofeedback. With this mind-body technique, you learn to control certain bodily functions -- such as muscle tension, heart rate and blood pressure -- to prevent headaches or reduce headache pain.    Proceed to emergency room if you experience new or worsening symptoms or symptoms do not resolve, if you have new neurologic symptoms or if headache is severe, or for any concerning symptom.   Provided education and documentation from American headache Society toolbox including articles on: chronic migraine medication overuse headache, chronic migraines, prevention of migraines, behavioral and other nonpharmacologic treatments for headache.  Cc: Noorani, Erenest Rasher, MD,  govert, joseph MD  Sarina Ill, MD  [facility] [address] [place], [place] [address]  Phone [phone] Fax [phone]

## 2019-12-22 ENCOUNTER — Ambulatory Visit: Payer: Managed Care, Other (non HMO) | Admitting: Neurology

## 2019-12-22 ENCOUNTER — Encounter: Payer: Self-pay | Admitting: *Deleted

## 2019-12-22 ENCOUNTER — Other Ambulatory Visit: Payer: Self-pay

## 2019-12-22 ENCOUNTER — Encounter: Payer: Self-pay | Admitting: Neurology

## 2019-12-22 VITALS — BP 96/64 | HR 70 | Temp 97.6°F | Ht 65.0 in | Wt 136.0 lb

## 2019-12-22 DIAGNOSIS — G43109 Migraine with aura, not intractable, without status migrainosus: Secondary | ICD-10-CM

## 2019-12-22 DIAGNOSIS — R5383 Other fatigue: Secondary | ICD-10-CM | POA: Diagnosis not present

## 2019-12-22 MED ORDER — CYCLOBENZAPRINE HCL 10 MG PO TABS: 10.0000 mg | ORAL_TABLET | Freq: Every day | ORAL | 3 refills | Status: AC

## 2019-12-22 NOTE — Patient Instructions (Addendum)
Cyclobenzaprine tablets What is this medicine? CYCLOBENZAPRINE (sye kloe BEN za preen) is a muscle relaxer. It is used to treat muscle pain, spasms, and stiffness. This medicine may be used for other purposes; ask your health care provider or pharmacist if you have questions. COMMON BRAND NAME(S): Fexmid, Flexeril What should I tell my health care provider before I take this medicine? They need to know if you have any of these conditions:  heart disease, irregular heartbeat, or previous heart attack  liver disease  thyroid problem  an unusual or allergic reaction to cyclobenzaprine, tricyclic antidepressants, lactose, other medicines, foods, dyes, or preservatives  pregnant or trying to get pregnant  breast-feeding How should I use this medicine? Take this medicine by mouth with a glass of water. Follow the directions on the prescription label. If this medicine upsets your stomach, take it with food or milk. Take your medicine at regular intervals. Do not take it more often than directed. Talk to your pediatrician regarding the use of this medicine in children. Special care may be needed. Overdosage: If you think you have taken too much of this medicine contact a poison control center or emergency room at once. NOTE: This medicine is only for you. Do not share this medicine with others. What if I miss a dose? If you miss a dose, take it as soon as you can. If it is almost time for your next dose, take only that dose. Do not take double or extra doses. What may interact with this medicine? Do not take this medicine with any of the following medications:  MAOIs like Carbex, Eldepryl, Marplan, Nardil, and Parnate  narcotic medicines for cough  safinamide This medicine may also interact with the following medications:  alcohol  bupropion  antihistamines for allergy, cough and cold  certain medicines for anxiety or sleep  certain medicines for bladder problems like oxybutynin,  tolterodine  certain medicines for depression like amitriptyline, fluoxetine, sertraline  certain medicines for Parkinson's disease like benztropine, trihexyphenidyl  certain medicines for seizures like phenobarbital, primidone  certain medicines for stomach problems like dicyclomine, hyoscyamine  certain medicines for travel sickness like scopolamine  general anesthetics like halothane, isoflurane, methoxyflurane, propofol  ipratropium  local anesthetics like lidocaine, pramoxine, tetracaine  medicines that relax muscles for surgery  narcotic medicines for pain  phenothiazines like chlorpromazine, mesoridazine, prochlorperazine, thioridazine  verapamil This list may not describe all possible interactions. Give your health care provider a list of all the medicines, herbs, non-prescription drugs, or dietary supplements you use. Also tell them if you smoke, drink alcohol, or use illegal drugs. Some items may interact with your medicine. What should I watch for while using this medicine? Tell your doctor or health care professional if your symptoms do not start to get better or if they get worse. You may get drowsy or dizzy. Do not drive, use machinery, or do anything that needs mental alertness until you know how this medicine affects you. Do not stand or sit up quickly, especially if you are an older patient. This reduces the risk of dizzy or fainting spells. Alcohol may interfere with the effect of this medicine. Avoid alcoholic drinks. If you are taking another medicine that also causes drowsiness, you may have more side effects. Give your health care provider a list of all medicines you use. Your doctor will tell you how much medicine to take. Do not take more medicine than directed. Call emergency for help if you have problems breathing or unusual sleepiness.  Your mouth may get dry. Chewing sugarless gum or sucking hard candy, and drinking plenty of water may help. Contact your  doctor if the problem does not go away or is severe. What side effects may I notice from receiving this medicine? Side effects that you should report to your doctor or health care professional as soon as possible:  allergic reactions like skin rash, itching or hives, swelling of the face, lips, or tongue  breathing problems  chest pain  fast, irregular heartbeat  hallucinations  seizures  unusually weak or tired Side effects that usually do not require medical attention (report to your doctor or health care professional if they continue or are bothersome):  headache  nausea, vomiting This list may not describe all possible side effects. Call your doctor for medical advice about side effects. You may report side effects to FDA at 1-800-FDA-1088. Where should I keep my medicine? Keep out of the reach of children. Store at room temperature between 15 and 30 degrees C (59 and 86 degrees F). Keep container tightly closed. Throw away any unused medicine after the expiration date. NOTE: This sheet is a summary. It may not cover all possible information. If you have questions about this medicine, talk to your doctor, pharmacist, or health care provider.  2020 Elsevier/Gold Standard (2018-10-28 12:49:26)   "There is increased risk for stroke in women with migraine with aura and a contraindication for the combined contraceptive pill for use by women who have migraine with aura. The risk for women with migraine without aura is lower. However other risk factors like smoking are far more likely to increase stroke risk than migraine. There is a recommendation for no smoking and for the use of OCPs without estrogen such as progestogen only pills particularly for women with migraine with aura.Marland Kitchen People who have migraine headaches with auras may be 3 times more likely to have a stroke caused by a blood clot, compared to migraine patients who don't see auras. Women who take hormone-replacement therapy  may be 30 percent more likely to suffer a clot-based stroke than women not taking medication containing estrogen. Other risk factors like smoking and high blood pressure may be  much more important."   Migraine Headache A migraine headache is an intense, throbbing pain on one side or both sides of the head. Migraine headaches may also cause other symptoms, such as nausea, vomiting, and sensitivity to light and noise. A migraine headache can last from 4 hours to 3 days. Talk with your doctor about what things may bring on (trigger) your migraine headaches. What are the causes? The exact cause of this condition is not known. However, a migraine may be caused when nerves in the brain become irritated and release chemicals that cause inflammation of blood vessels. This inflammation causes pain. This condition may be triggered or caused by:  Drinking alcohol.  Smoking.  Taking medicines, such as: ? Medicine used to treat chest pain (nitroglycerin). ? Birth control pills. ? Estrogen. ? Certain blood pressure medicines.  Eating or drinking products that contain nitrates, glutamate, aspartame, or tyramine. Aged cheeses, chocolate, or caffeine may also be triggers.  Doing physical activity. Other things that may trigger a migraine headache include:  Menstruation.  Pregnancy.  Hunger.  Stress.  Lack of sleep or too much sleep.  Weather changes.  Fatigue. What increases the risk? The following factors may make you more likely to experience migraine headaches:  Being a certain age. This condition is more common in  people who are 1-55 years old.  Being female.  Having a family history of migraine headaches.  Being Caucasian.  Having a mental health condition, such as depression or anxiety.  Being obese. What are the signs or symptoms? The main symptom of this condition is pulsating or throbbing pain. This pain may:  Happen in any area of the head, such as on one side or both  sides.  Interfere with daily activities.  Get worse with physical activity.  Get worse with exposure to bright lights or loud noises. Other symptoms may include:  Nausea.  Vomiting.  Dizziness.  General sensitivity to bright lights, loud noises, or smells. Before you get a migraine headache, you may get warning signs (an aura). An aura may include:  Seeing flashing lights or having blind spots.  Seeing bright spots, halos, or zigzag lines.  Having tunnel vision or blurred vision.  Having numbness or a tingling feeling.  Having trouble talking.  Having muscle weakness. Some people have symptoms after a migraine headache (postdromal phase), such as:  Feeling tired.  Difficulty concentrating. How is this diagnosed? A migraine headache can be diagnosed based on:  Your symptoms.  A physical exam.  Tests, such as: ? CT scan or an MRI of the head. These imaging tests can help rule out other causes of headaches. ? Taking fluid from the spine (lumbar puncture) and analyzing it (cerebrospinal fluid analysis, or CSF analysis). How is this treated? This condition may be treated with medicines that:  Relieve pain.  Relieve nausea.  Prevent migraine headaches. Treatment for this condition may also include:  Acupuncture.  Lifestyle changes like avoiding foods that trigger migraine headaches.  Biofeedback.  Cognitive behavioral therapy. Follow these instructions at home: Medicines  Take over-the-counter and prescription medicines only as told by your health care provider.  Ask your health care provider if the medicine prescribed to you: ? Requires you to avoid driving or using heavy machinery. ? Can cause constipation. You may need to take these actions to prevent or treat constipation:  Drink enough fluid to keep your urine pale yellow.  Take over-the-counter or prescription medicines.  Eat foods that are high in fiber, such as beans, whole grains, and fresh  fruits and vegetables.  Limit foods that are high in fat and processed sugars, such as fried or sweet foods. Lifestyle  Do not drink alcohol.  Do not use any products that contain nicotine or tobacco, such as cigarettes, e-cigarettes, and chewing tobacco. If you need help quitting, ask your health care provider.  Get at least 8 hours of sleep every night.  Find ways to manage stress, such as meditation, deep breathing, or yoga. General instructions      Keep a journal to find out what may trigger your migraine headaches. For example, write down: ? What you eat and drink. ? How much sleep you get. ? Any change to your diet or medicines.  If you have a migraine headache: ? Avoid things that make your symptoms worse, such as bright lights. ? It may help to lie down in a dark, quiet room. ? Do not drive or use heavy machinery. ? Ask your health care provider what activities are safe for you while you are experiencing symptoms.  Keep all follow-up visits as told by your health care provider. This is important. Contact a health care provider if:  You develop symptoms that are different or more severe than your usual migraine headache symptoms.  You have more  than 15 headache days in one month. Get help right away if:  Your migraine headache becomes severe.  Your migraine headache lasts longer than 72 hours.  You have a fever.  You have a stiff neck.  You have vision loss.  Your muscles feel weak or like you cannot control them.  You start to lose your balance often.  You have trouble walking.  You faint.  You have a seizure. Summary  A migraine headache is an intense, throbbing pain on one side or both sides of the head. Migraines may also cause other symptoms, such as nausea, vomiting, and sensitivity to light and noise.  This condition may be treated with medicines and lifestyle changes. You may also need to avoid certain things that trigger a migraine  headache.  Keep a journal to find out what may trigger your migraine headaches.  Contact your health care provider if you have more than 15 headache days in a month or you develop symptoms that are different or more severe than your usual migraine headache symptoms. This information is not intended to replace advice given to you by your health care provider. Make sure you discuss any questions you have with your health care provider. Document Revised: 03/19/2019 Document Reviewed: 01/07/2019 Elsevier Patient Education  2020 [employer].

## 2019-12-23 LAB — TSH: TSH: 1.66 u[IU]/mL (ref 0.450–4.500)

## 2019-12-23 LAB — SAR COV2 SEROLOGY (COVID19)AB(IGG),IA: DiaSorin SARS-CoV-2 Ab, IgG: NEGATIVE

## 2023-06-26 ENCOUNTER — Other Ambulatory Visit: Payer: Self-pay | Admitting: Family

## 2023-06-26 DIAGNOSIS — N63 Unspecified lump in unspecified breast: Secondary | ICD-10-CM
